# Patient Record
Sex: Female | Born: 1955
Health system: Southern US, Community
[De-identification: ages and names within clinical notes are randomized; demographics above are authoritative.]

## PROBLEM LIST (undated history)

## (undated) DIAGNOSIS — K219 Gastro-esophageal reflux disease without esophagitis: Secondary | ICD-10-CM

## (undated) DIAGNOSIS — F419 Anxiety disorder, unspecified: Secondary | ICD-10-CM

## (undated) DIAGNOSIS — D649 Anemia, unspecified: Secondary | ICD-10-CM

## (undated) DIAGNOSIS — F329 Major depressive disorder, single episode, unspecified: Secondary | ICD-10-CM

## (undated) DIAGNOSIS — F32A Depression, unspecified: Secondary | ICD-10-CM

## (undated) HISTORY — DX: Depression, unspecified: F32.A

## (undated) HISTORY — DX: Major depressive disorder, single episode, unspecified: F32.9

## (undated) HISTORY — DX: Anemia, unspecified: D64.9

## (undated) HISTORY — PX: BREAST SURGERY: SHX581

## (undated) HISTORY — PX: TONSILECTOMY, ADENOIDECTOMY, BILATERAL MYRINGOTOMY AND TUBES: SHX2538

## (undated) HISTORY — DX: Anxiety disorder, unspecified: F41.9

## (undated) HISTORY — DX: Gastro-esophageal reflux disease without esophagitis: K21.9

## (undated) HISTORY — PX: OTHER SURGICAL HISTORY: SHX169

---

## 2000-07-26 ENCOUNTER — Encounter: Payer: Self-pay | Admitting: Family Medicine

## 2000-07-26 ENCOUNTER — Encounter: Admission: RE | Admit: 2000-07-26 | Discharge: 2000-07-26 | Payer: Self-pay | Admitting: Family Medicine

## 2003-11-06 ENCOUNTER — Other Ambulatory Visit: Admission: RE | Admit: 2003-11-06 | Discharge: 2003-11-06 | Payer: Self-pay | Admitting: Family Medicine

## 2005-09-20 ENCOUNTER — Encounter: Admission: RE | Admit: 2005-09-20 | Discharge: 2005-09-20 | Payer: Self-pay | Admitting: Family Medicine

## 2005-10-25 ENCOUNTER — Ambulatory Visit (HOSPITAL_BASED_OUTPATIENT_CLINIC_OR_DEPARTMENT_OTHER): Admission: RE | Admit: 2005-10-25 | Discharge: 2005-10-25 | Payer: Self-pay | Admitting: Plastic Surgery

## 2005-10-25 ENCOUNTER — Encounter (INDEPENDENT_AMBULATORY_CARE_PROVIDER_SITE_OTHER): Payer: Self-pay | Admitting: *Deleted

## 2006-11-28 ENCOUNTER — Encounter: Admission: RE | Admit: 2006-11-28 | Discharge: 2006-11-28 | Payer: Self-pay | Admitting: Family Medicine

## 2006-12-18 ENCOUNTER — Other Ambulatory Visit: Admission: RE | Admit: 2006-12-18 | Discharge: 2006-12-18 | Payer: Self-pay | Admitting: Family Medicine

## 2007-02-26 ENCOUNTER — Ambulatory Visit (HOSPITAL_COMMUNITY): Admission: RE | Admit: 2007-02-26 | Discharge: 2007-02-26 | Payer: Self-pay | Admitting: Gastroenterology

## 2007-05-21 ENCOUNTER — Emergency Department (HOSPITAL_COMMUNITY): Admission: EM | Admit: 2007-05-21 | Discharge: 2007-05-22 | Payer: Self-pay | Admitting: Emergency Medicine

## 2007-12-19 ENCOUNTER — Encounter: Admission: RE | Admit: 2007-12-19 | Discharge: 2007-12-19 | Payer: Self-pay | Admitting: Family Medicine

## 2008-10-07 ENCOUNTER — Encounter: Admission: RE | Admit: 2008-10-07 | Discharge: 2008-10-07 | Payer: Self-pay | Admitting: Family Medicine

## 2010-01-01 ENCOUNTER — Other Ambulatory Visit: Admission: RE | Admit: 2010-01-01 | Discharge: 2010-01-01 | Payer: Self-pay | Admitting: Family Medicine

## 2010-01-25 ENCOUNTER — Encounter: Admission: RE | Admit: 2010-01-25 | Discharge: 2010-01-25 | Payer: Self-pay | Admitting: Family Medicine

## 2011-04-08 NOTE — Op Note (Signed)
Renee Roberts, Renee Roberts NO.:  [id number]   MEDICAL RECORD NO.:  [id number]          PATIENT TYPE:  AMB   LOCATION:  DSC                          FACILITY:  [facility]   PHYSICIAN:  Etter Sjogren, M.D.     DATE OF BIRTH:  June 26, 1956   DATE OF PROCEDURE:  10/25/2005  DATE OF DISCHARGE:                                 OPERATIVE REPORT   PREOPERATIVE DIAGNOSES:  1.  Lesion upper lip, 0.5 cm.  2.  Lesion abdomen 0.5 cm.  3.  Complicated open wound lip 1.0 cm.  4.  Complicated open wound of the abdomen 2.5 cm.   Dictation ended at this point.      Etter Sjogren, M.D.  Electronically Signed     DB/MEDQ  D:  10/25/2005  T:  10/26/2005  Job:  [id number]

## 2011-04-08 NOTE — Op Note (Signed)
NAMEKELSHA, OLDER NO.:  [id number]   MEDICAL RECORD NO.:  [id number]          PATIENT TYPE:  AMB   LOCATION:  DSC                          FACILITY:  [facility]   PHYSICIAN:  Etter Sjogren, M.D.     DATE OF BIRTH:  May 14, 1956   DATE OF PROCEDURE:  10/25/2005  DATE OF DISCHARGE:                                 OPERATIVE REPORT   PREOPERATIVE DIAGNOSES:  1.  Lesion, upper lip, 0.5 cm, undetermined behavior.  2.  Lesion of abdomen, 0.5 cm, undetermined behavior.   POSTOPERATIVE DIAGNOSES:  1.  Lesion, upper lip, 0.5 cm, undetermined behavior.  2.  Lesion of abdomen, 0.5 cm, undetermined behavior.  3.  Complicated wound of the lip, 1 cm.  4.  Complicated open wound of abdomen, 2.5 cm.   PROCEDURE PERFORMED:  1.  Excision of lesion of lip, 0.5 cm.  2.  Excision of lesion of abdomen, 0.5 cm.  3.  Complex wound closure of lip, 1 cm.  4.  Complex wound closure of abdomen, 2.5 cm.   SURGEON:  Etter Sjogren, M.D.   ANESTHESIA:  Xylocaine 1% with epinephrine plus bicarb.   CLINICAL NOTE:  This 55 year old woman has a lesion of the lip and abdomen  that have changed, enlarged and it is medically necessary to remove them.  The nature of the procedure and risks are well-understood by her including  the possibility for further surgery depending upon the final pathology  report.  She understood this and wished to proceed.   DESCRIPTION OF PROCEDURE:  The patient was marked for the excisions after  being prepped with Betadine and draped with sterile drapes.  Successful  local anesthesia was achieved and the excisions were performed.  The  specimens were removed and wounds cleansed thoroughly, irrigated thoroughly  and layered closures with 5-0 Monocryl interrupted and inverted deep sutures  and 5-0 Monocryl interrupted and deep dermal, and 6-0 Prolene simple running  suture.  Antibiotic ointment was applied, tolerated well.   We will see her back next week for  recheck.     Etter Sjogren, M.D.  Electronically Signed    DB/MEDQ  D:  10/25/2005  T:  10/26/2005  Job:  [id number]

## 2011-04-08 NOTE — Op Note (Signed)
NAMEJENIKA, Renee Roberts                 ACCOUNT NO.:  [id number]   MEDICAL RECORD NO.:  [id number]          PATIENT TYPE:  AMB   LOCATION:  ENDO                         FACILITY:  [facility]   PHYSICIAN:  John C. Madilyn Fireman, M.D.    DATE OF BIRTH:  1956/11/02   DATE OF PROCEDURE:  02/26/2007  DATE OF DISCHARGE:                               OPERATIVE REPORT   PROCEDURE:  Colonoscopy.   INDICATIONS FOR PROCEDURE:  Family history of colon cancer in two second-  degree relatives.   PROCEDURE:  The patient was placed in the left lateral decubitus  position and placed on the pulse monitor with continuous low-flow oxygen  delivered by nasal cannula.  She was sedated with 50 mcg IV fentanyl and  3 mg IV Versed.  The Olympus video colonoscope was inserted into the  rectum and advanced to the cecum, confirmed by transillumination of  McBurney's point and visualization of ileocecal valve and appendiceal  orifice.  The prep was excellent.  The cecum appeared normal with no  masses, polyps, diverticula or other mucosal abnormalities.  Within the  ascending colon there were seen several scattered diverticula.  No other  abnormalities.  The transverse colon appeared normal.  Within the  descending and sigmoid colon there were seen several scattered  diverticula with no other abnormalities.  The rectum appeared normal.  On retroflexed view, the anus revealed no obvious internal hemorrhoids.  The scope was then withdrawn and the patient returned to the recovery  room in stable condition.  He tolerated the procedure well.  There were  no immediate complications.   IMPRESSION:  Diverticulosis, otherwise normal study.   PLAN:  Repeat colonoscopy in 5-10 years.           ______________________________  Everardo All Madilyn Fireman, M.D.     JCH/MEDQ  D:  02/26/2007  T:  02/26/2007  Job:  [id number]   cc:   Sigmund Hazel, M.D.

## 2011-07-19 ENCOUNTER — Inpatient Hospital Stay (INDEPENDENT_AMBULATORY_CARE_PROVIDER_SITE_OTHER)
Admission: RE | Admit: 2011-07-19 | Discharge: 2011-07-19 | Disposition: A | Discharge status: Home / Self Care | Payer: 59 | Source: Ambulatory Visit | Attending: Family Medicine | Admitting: Family Medicine

## 2011-07-19 ENCOUNTER — Ambulatory Visit (INDEPENDENT_AMBULATORY_CARE_PROVIDER_SITE_OTHER): Payer: 59

## 2011-07-19 DIAGNOSIS — S92919A Unspecified fracture of unspecified toe(s), initial encounter for closed fracture: Secondary | ICD-10-CM

## 2013-06-14 ENCOUNTER — Other Ambulatory Visit: Payer: Self-pay

## 2013-06-14 DIAGNOSIS — Z1231 Encounter for screening mammogram for malignant neoplasm of breast: Secondary | ICD-10-CM

## 2013-06-25 ENCOUNTER — Ambulatory Visit
Admission: RE | Admit: 2013-06-25 | Discharge: 2013-06-25 | Disposition: A | Discharge status: Home / Self Care | Payer: 59 | Source: Ambulatory Visit

## 2013-06-25 DIAGNOSIS — Z1231 Encounter for screening mammogram for malignant neoplasm of breast: Secondary | ICD-10-CM

## 2013-07-26 ENCOUNTER — Other Ambulatory Visit: Payer: Self-pay | Admitting: Gastroenterology

## 2015-07-15 ENCOUNTER — Other Ambulatory Visit: Payer: Self-pay

## 2016-01-13 ENCOUNTER — Emergency Department
Admission: EM | Admit: 2016-01-13 | Discharge: 2016-01-13 | Disposition: A | Discharge status: Home / Self Care | Payer: 59 | Source: Home / Self Care | Attending: Family Medicine | Admitting: Family Medicine

## 2016-01-13 ENCOUNTER — Encounter: Payer: Self-pay | Admitting: *Deleted

## 2016-01-13 DIAGNOSIS — R1084 Generalized abdominal pain: Secondary | ICD-10-CM | POA: Diagnosis not present

## 2016-01-13 DIAGNOSIS — M533 Sacrococcygeal disorders, not elsewhere classified: Secondary | ICD-10-CM | POA: Diagnosis not present

## 2016-01-13 MED ORDER — CYCLOBENZAPRINE HCL 10 MG PO TABS: 10.0000 mg | ORAL_TABLET | Freq: Three times a day (TID) | ORAL | Status: DC

## 2016-01-13 MED ORDER — MELOXICAM 15 MG PO TABS: 15.0000 mg | ORAL_TABLET | Freq: Every day | ORAL | Status: DC

## 2016-01-13 NOTE — Discharge Instructions (Signed)
Begin clear liquids for about 18 to 24 hours, then may begin a BRAT diet (Bananas, Rice, Applesauce, Toast) when abdominal pain decreases.  Then gradually advance to a regular diet as tolerated.   Apply ice pack to right lower back for 20 to 30 minutes, 3 to 4 times daily  Continue until pain decreases.  Begin back exercises as tolerated.   If symptoms become significantly worse during the night or over the weekend, proceed to the local emergency room.

## 2016-01-13 NOTE — ED Provider Notes (Signed)
CSN: [id number]     Arrival date & time 01/13/16  [id number] History   First MD Initiated Contact with Patient 01/13/16 1018     Chief Complaint  Patient presents with  . Back Pain      HPI Comments: While vigorously exercising 2 days ago, patient suddenly experienced right lower back pain.  The pain does not radiate but has persisted and is worse when sitting.  Since onset of her right lower back pain she has developed vague abdominal bloating, discomfort, and constipation.  She had a similar right low back strain about five years ago resulting in similar GI symptoms.  A GI workup at that time was negative.  She notes that she has similar recurrent symptoms about every 3 to 6 months.  The GI symptoms always resolve spontaneously.  Patient is a 60 y.o. female presenting with back pain. The history is provided by the patient.  Back Pain Location:  Sacro-iliac joint Quality:  Aching Radiates to:  Does not radiate Pain severity:  Moderate Pain is:  Same all the time Onset quality:  Sudden Duration:  2 days Timing:  Constant Progression:  Unchanged Chronicity:  Recurrent Context comment:  Athletic activity Relieved by:  NSAIDs Worsened by:  Sitting and movement Ineffective treatments:  None tried Associated symptoms: abdominal pain and abdominal swelling   Associated symptoms: no bladder incontinence, no bowel incontinence, no dysuria, no fever, no leg pain, no numbness, no paresthesias, no pelvic pain, no perianal numbness, no tingling, no weakness and no weight loss   Abdominal pain:    Location:  Generalized   Quality:  Bloating   Severity:  Moderate   Onset quality:  Gradual   Duration:  2 days   Timing:  Constant   Progression:  Unchanged   Chronicity:  Recurrent   History reviewed. No pertinent past medical history. Past Surgical History  Procedure Laterality Date  . Breast surgery    . Cesarean section      x 3   Family History  Problem Relation Age of Onset  . Colon  cancer Father    Social History  Substance Use Topics  . Smoking status: Never Smoker   . Smokeless tobacco: None  . Alcohol Use: No   OB History    No data available     Review of Systems  Constitutional: Negative for fever and weight loss.  Gastrointestinal: Positive for abdominal pain. Negative for bowel incontinence.  Genitourinary: Negative for bladder incontinence, dysuria and pelvic pain.  Musculoskeletal: Positive for back pain.  Neurological: Negative for tingling, weakness, numbness and paresthesias.  All other systems reviewed and are negative.   Allergies  Review of patient's allergies indicates no known allergies.  Home Medications   Prior to Admission medications   Medication Sig Start Date End Date Taking? Authorizing Provider  cyclobenzaprine (FLEXERIL) 10 MG tablet Take 1 tablet (10 mg total) by mouth 3 (three) times daily. 01/13/16   Kandra Nicolas, MD  meloxicam (MOBIC) 15 MG tablet Take 1 tablet (15 mg total) by mouth daily. Take with food each morning 01/13/16   Kandra Nicolas, MD   Meds Ordered and Administered this Visit  Medications - No data to display  BP 128/82 mmHg  Pulse 78  Temp(Src) 97.6 F (36.4 C) (Oral)  Resp 18  Ht 5\' 6"  (1.676 m)  Wt 181 lb (82.101 kg)  BMI 29.23 kg/m2  SpO2 98% No data found.   Physical Exam  Constitutional: She is oriented  to person, place, and time. She appears well-developed and well-nourished. No distress.  HENT:  Head: Normocephalic.  Mouth/Throat: Oropharynx is clear and moist.  Eyes: Conjunctivae are normal. Pupils are equal, round, and reactive to light.  Neck: Neck supple.  Cardiovascular: Normal heart sounds.   Pulmonary/Chest: Breath sounds normal.  Abdominal: Soft. Normal appearance and bowel sounds are normal. She exhibits no distension and no mass. There is no hepatosplenomegaly. There is tenderness. There is tenderness at McBurney's point. There is no rigidity, no rebound, no guarding, no CVA  tenderness and negative Murphy's sign. No hernia.  Abdomen has vague diffuse tenderness without masses or hepatosplenomegaly.  Musculoskeletal: She exhibits no edema or tenderness.       Lumbar back: She exhibits decreased range of motion.  Back:  Decreased range of motion. Can heel/toe walk and squat without difficulty.  There is tenderness to palpation over the right SI joint. Straight leg raising test is negative.  Sitting knee extension test is negative.  Strength and sensation in the lower extremities is normal.  Patellar and achilles reflexes are normal. FABER lateralizes to the right SI joint. Right leg is approximately 1cm shorter than the left by inspection.   Lymphadenopathy:    She has no cervical adenopathy.  Neurological: She is alert and oriented to person, place, and time.  Skin: Skin is warm and dry. No rash noted.  Nursing note and vitals reviewed.   ED Course  Procedures  none  MDM   1. Sacroiliac joint dysfunction of right side   2. Generalized abdominal pain    Begin Mobid 15mg  daily, and Flexeril 10mg  TID Begin clear liquids for about 18 to 24 hours, then may begin a [employer] (Bananas, Rice, Applesauce, Toast) when abdominal pain decreases.  Then gradually advance to a regular diet as tolerated.   Apply ice pack to right lower back for 20 to 30 minutes, 3 to 4 times daily  Continue until pain decreases.  Begin back exercises as tolerated.   If symptoms become significantly worse during the night or over the weekend, proceed to the local emergency room.   Followup with Family Doctor if not improved in one to two weeks    Kandra Nicolas, MD 01/13/16 2026

## 2016-01-13 NOTE — ED Notes (Signed)
Pt c/o RT sided LBP x 2 days after exercising with bloating and constipation. She took Aleve at 0400 with some relief. She reports having this pain in the past. She had a GI series done 8 years ago and saw a GI MD 5 years ago without a dx.

## 2016-01-16 ENCOUNTER — Telehealth: Payer: Self-pay | Admitting: Emergency Medicine

## 2016-11-21 HISTORY — PX: REDUCTION MAMMAPLASTY: SUR839

## 2017-04-20 ENCOUNTER — Encounter: Payer: Self-pay | Admitting: Gastroenterology

## 2017-06-07 ENCOUNTER — Ambulatory Visit (AMBULATORY_SURGERY_CENTER): Payer: Self-pay

## 2017-06-07 VITALS — Ht 66.0 in | Wt 177.8 lb

## 2017-06-07 DIAGNOSIS — Z1211 Encounter for screening for malignant neoplasm of colon: Secondary | ICD-10-CM

## 2017-06-07 MED ORDER — NA SULFATE-K SULFATE-MG SULF 17.5-3.13-1.6 GM/177ML PO SOLN: 1.0000 | Freq: Once | ORAL | 0 refills | Status: AC

## 2017-06-07 NOTE — Progress Notes (Signed)
Denies allergies to eggs or soy products. Denies complication of anesthesia or sedation. Denies use of weight loss medication. Denies use of O2.   Emmi instructions given for colonoscopy.  

## 2017-06-08 ENCOUNTER — Encounter: Payer: Self-pay | Admitting: Gastroenterology

## 2017-06-19 ENCOUNTER — Telehealth: Payer: Self-pay | Admitting: Gastroenterology

## 2017-06-19 DIAGNOSIS — Z1211 Encounter for screening for malignant neoplasm of colon: Secondary | ICD-10-CM

## 2017-06-19 MED ORDER — SUPREP BOWEL PREP KIT 17.5-3.13-1.6 GM/177ML PO SOLN: 1.0000 | Freq: Once | ORAL | 0 refills | Status: AC

## 2017-06-19 MED ORDER — SUPREP BOWEL PREP KIT 17.5-3.13-1.6 GM/177ML PO SOLN: 1.0000 | Freq: Once | ORAL | 0 refills | Status: DC

## 2017-06-19 NOTE — Telephone Encounter (Signed)
Prep was accidentally sent to another [facility] in another [place].  Resent prep and called pharmacy. Julene Rahn/PV

## 2017-06-20 ENCOUNTER — Telehealth: Payer: Self-pay | Admitting: Gastroenterology

## 2017-06-20 NOTE — Telephone Encounter (Signed)
Understood. I will not charge a late cancel fee because I was able to fill the slot with a clinic patient from today. However, I would like to investigate how this only came to light the day prior to the procedure if usual benefits review was done after the procedure was originally scheduled.

## 2017-06-21 ENCOUNTER — Encounter: Payer: 59 | Admitting: Gastroenterology

## 2017-06-21 NOTE — Telephone Encounter (Signed)
Mrs Fernholz was mailed a letter on [phone] from our [employer].  Which gave her plenty of time to call and cancel her procedure.  Her insurance quoted that they would cover her procedure at 95% which is actually really good. She still has $1348 on her out of pocket to meet before they cover at 100% which might be why she probably cancelled.So it's not that her insurance will not cover the procedure, they do.  Just not 100% of it at this time.

## 2018-02-19 ENCOUNTER — Encounter (HOSPITAL_COMMUNITY): Payer: Self-pay

## 2018-02-19 ENCOUNTER — Other Ambulatory Visit: Payer: Self-pay

## 2018-02-19 ENCOUNTER — Emergency Department (HOSPITAL_COMMUNITY)
Admission: EM | Admit: 2018-02-19 | Discharge: 2018-02-19 | Disposition: A | Discharge status: Home / Self Care | Payer: 59 | Attending: Emergency Medicine | Admitting: Emergency Medicine

## 2018-02-19 DIAGNOSIS — M542 Cervicalgia: Secondary | ICD-10-CM | POA: Insufficient documentation

## 2018-02-19 DIAGNOSIS — M545 Low back pain, unspecified: Secondary | ICD-10-CM

## 2018-02-19 DIAGNOSIS — Y9389 Activity, other specified: Secondary | ICD-10-CM | POA: Insufficient documentation

## 2018-02-19 DIAGNOSIS — X500XXA Overexertion from strenuous movement or load, initial encounter: Secondary | ICD-10-CM | POA: Diagnosis not present

## 2018-02-19 MED ORDER — CYCLOBENZAPRINE HCL 10 MG PO TABS: 10.0000 mg | ORAL_TABLET | Freq: Three times a day (TID) | ORAL | 0 refills | Status: DC | PRN

## 2018-02-19 NOTE — ED Notes (Signed)
Pt noted to be tearful during assessment.

## 2018-02-19 NOTE — ED Provider Notes (Signed)
[facility] Provider Note   CSN: [id number] Arrival date & time: 02/19/18  2725  History   Chief Complaint Chief Complaint  Patient presents with  . Back Pain    HPI Renee Roberts is a 62 y.o. female.  HPI  Patient is a 62yo F presenting with low back pain. Began 5d ago. Lifted a heavy bag of soil about 8 days ago, but did not develop severe pain until 5d ago. Pain is worse with changes in position, twisting motions, and sitting. Pain began to improve two days ago, however she then drove to [place], which worsened pain after being seated for an extended period of time. Pain began in R lumbar region, is now primarily on L. Intermittently has sharp shooting pain that radiates across lumbar region. No radiation of pain down legs. Denies numbness, tingling, weakness of lower extremities. Denies bowel or bladder incontinence. Has been taking naproxen and using a heating pad, both of which have been helpful. Has had many similar episodes of pain in the past, usually occurred about every 2 years and typically precipitated by lifting a heavy object. Is concerned because current episode is lasting longer than previous episodes. In the past, Flexeril has been very helpful. 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 Endorses mild neck pain a couple days ago that has since resolved.    Past Medical History:  Diagnosis Date  . Anemia   . Anxiety   . Depression   . GERD (gastroesophageal reflux disease)     There are no active problems to display for this patient.   Past Surgical History:  Procedure Laterality Date  . BREAST SURGERY    . CESAREAN SECTION     x 3  . Fractured Finger Left    fifth finger  . TONSILECTOMY, ADENOIDECTOMY, BILATERAL MYRINGOTOMY AND TUBES       OB History   None      Home Medications    Prior to Admission medications   Medication Sig Start Date End Date Taking? Authorizing Provider    cyclobenzaprine (FLEXERIL) 10 MG tablet Take 1 tablet (10 mg total) by mouth 3 (three) times daily as needed for muscle spasms. 02/19/18   Verner Mould, MD    Family History Family History  Problem Relation Age of Onset  . Pancreatic cancer Maternal Grandmother   . Colon cancer Paternal Grandfather   . Esophageal cancer Neg Hx   . Rectal cancer Neg Hx   . Stomach cancer Neg Hx     Social History Social History   Tobacco Use  . Smoking status: Never Smoker  . Smokeless tobacco: Never Used  Substance Use Topics  . Alcohol use: No  . Drug use: No     Allergies   Patient has no known allergies.   Review of Systems Review of Systems  Gastrointestinal: Negative for constipation and diarrhea.  Musculoskeletal: Positive for back pain and neck pain.  Neurological: Negative for weakness and numbness.     Physical Exam Updated Vital Signs BP (!) 162/102 (BP Location: Right Arm)   Pulse (!) 120   Temp 97.9 F (36.6 C) (Oral)   Resp 20   SpO2 100%   Physical Exam  Constitutional: She is oriented to person, place, and time. She appears well-developed and well-nourished. No distress.  Standing throughout encounter as reports it is too painful to sit down  HENT:  Head: Normocephalic and atraumatic.  Nose: Nose normal.  Mouth/Throat: Oropharynx is clear and moist. No oropharyngeal exudate.  Eyes: Pupils are equal, round, and reactive to light. Conjunctivae and EOM are normal. Right eye exhibits no discharge. Left eye exhibits no discharge.  Neck: Normal range of motion. Neck supple.  Cardiovascular: Normal rate, regular rhythm and normal heart sounds.  No murmur heard. Pulmonary/Chest: Effort normal and breath sounds normal. No respiratory distress.  Abdominal: Soft. Bowel sounds are normal. She exhibits no distension. There is no tenderness.  Musculoskeletal:  No TTP of back, including lumbar region.  Full ROM, though reports pain worse with spinal flexion  and patient hesitant to perform this movement.  5/5 strength upper extremities bilaterally.  Able to walk and stand with no difficulty and no assistance.   Neurological: She is alert and oriented to person, place, and time.  Skin: Skin is warm and dry.  Psychiatric:  Tearful throughout encounter     ED Treatments / Results  Labs (all labs ordered are listed, but only abnormal results are displayed) Labs Reviewed - No data to display  EKG None  Radiology No results found.  Procedures Procedures (including critical care time)  Medications Ordered in ED Medications - No data to display   Initial Impression / Assessment and Plan / ED Course  I have reviewed the triage vital signs and the nursing notes.  Pertinent labs & imaging results that were available during my care of the patient were reviewed by me and considered in my medical decision making (see chart for details).     62 yo F presenting with low back pain. Most consistent with MSK etiology beginning after lifting heavy bag of soil. No TTP of lumbar region, though worsening with certain movements suggestive of MSK etiology. Full ROM though does have significant pain with spinal flexion. As such, could consider compression fracture, however patient very active (is a physical therapist) and relatively young so this is less likely. No red flags, so less likely cauda equina or similar neurologic etiology, and imaging not indicated at this time. Will treat conservatively with scheduled NSAIDs over next 3-5d as well as Flexeril TID PRN. Can continue heat as well. Will recommend ortho f/u. Could consider imaging to rule out compression fx or other possible etiologies if sx continue to recur. Return precautions discussed.   Final Clinical Impressions(s) / ED Diagnoses   Final diagnoses:  Acute bilateral low back pain without sciatica    ED Discharge Orders        Ordered    cyclobenzaprine (FLEXERIL) 10 MG tablet  3 times  daily PRN     02/19/18 0938     Adin Hector, MD, MPH PGY-3 [facility] Pager [phone]    Verner Mould, MD 02/19/18 9201    Carmin Muskrat, MD 02/19/18 [phone]

## 2018-02-19 NOTE — ED Notes (Signed)
EDP at bedside  

## 2018-02-19 NOTE — ED Triage Notes (Signed)
Pt states hx of back pain that occurs every couple of years. She states the pain generally starts after lifting something and is worsened by certain chemicals in food. Pt states she was having a bowel movement this morning and she began having severe sharp pains and spasms in her back. Pt tearful in triage.

## 2018-02-19 NOTE — ED Notes (Signed)
Patient verbalizes understanding of discharge instructions. Opportunity for questioning and answers were provided. Armband removed by staff, pt discharged from ED.  

## 2018-02-19 NOTE — Discharge Instructions (Signed)
For your back pain, please begin taking two regular strength ibuprofen tablets (400 mg total) every 8 hours for the next 3-5 days. Alternatively, you can take two regular strength naproxen tablets (500 mg total) every 12 hours for the next 3-5 days. You can also take one Flexeril tablet up to every 8 hours as needed for pain or back spasms. You can continue to use the heating pad as needed. Try to stay mobile, however avoid any heavy lifting.  If your pain worsens, you have numbness or weakness of your legs, or you have difficulty controlling your bladder or bowels, please return to the emergency room.

## 2018-03-25 DIAGNOSIS — H1033 Unspecified acute conjunctivitis, bilateral: Secondary | ICD-10-CM | POA: Diagnosis not present

## 2019-04-05 DIAGNOSIS — U071 COVID-19: Secondary | ICD-10-CM | POA: Diagnosis not present

## 2019-06-11 ENCOUNTER — Other Ambulatory Visit: Payer: Self-pay

## 2019-06-11 ENCOUNTER — Encounter (HOSPITAL_COMMUNITY): Payer: Self-pay

## 2019-06-11 ENCOUNTER — Ambulatory Visit (HOSPITAL_COMMUNITY)
Admission: EM | Admit: 2019-06-11 | Discharge: 2019-06-11 | Disposition: A | Discharge status: Home / Self Care | Payer: 59 | Attending: Family Medicine | Admitting: Family Medicine

## 2019-06-11 DIAGNOSIS — Z20828 Contact with and (suspected) exposure to other viral communicable diseases: Secondary | ICD-10-CM | POA: Insufficient documentation

## 2019-06-11 DIAGNOSIS — H1031 Unspecified acute conjunctivitis, right eye: Secondary | ICD-10-CM | POA: Diagnosis not present

## 2019-06-11 DIAGNOSIS — Z20822 Contact with and (suspected) exposure to covid-19: Secondary | ICD-10-CM

## 2019-06-11 MED ORDER — TOBRAMYCIN 0.3 % OP SOLN: 1.0000 [drp] | OPHTHALMIC | 0 refills | Status: DC

## 2019-06-11 NOTE — Discharge Instructions (Addendum)
Use eyedrops every 4 hours for the next few days until redness is relieved Careful handwashing to prevent spread Stay out of work until coronavirus test is confirmed negative

## 2019-06-11 NOTE — ED Triage Notes (Signed)
Patient presents to Urgent Care with complaints of right eye irritation since last night. Patient reports she thinks it is pink eye.

## 2019-06-11 NOTE — ED Provider Notes (Signed)
[place]    CSN: [id number] Arrival date & time: 06/11/19  9233      History   Chief Complaint Chief Complaint  Patient presents with  . Appointment    9:10  . Conjunctivitis    HPI Renee Roberts is a 63 y.o. female.   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  She went to work this morning although she was having some irritation in her right eye.  As the morning is progressed it is become more irritated and red.  Some tearing.  Some yellow discharge this morning.  No foreign body sensation.  No trouble with vision.  She is also had some mild runny nose.  No known exposure to COVID-19.  No fever chills.  No body aches.  No coughing or shortness of breath.  No GI symptoms  Past Medical History:  Diagnosis Date  . Anemia   . Anxiety   . Depression   . GERD (gastroesophageal reflux disease)     There are no active problems to display for this patient.   Past Surgical History:  Procedure Laterality Date  . BREAST SURGERY    . CESAREAN SECTION     x 3  . Fractured Finger Left    fifth finger  . TONSILECTOMY, ADENOIDECTOMY, BILATERAL MYRINGOTOMY AND TUBES      OB History   No obstetric history on file.      Home Medications    Prior to Admission medications   Medication Sig Start Date End Date Taking? Authorizing Provider  tobramycin (TOBREX) 0.3 % ophthalmic solution Place 1 drop into the right eye every 4 (four) hours. 06/11/19   Raylene Everts, MD    Family History Family History  Problem Relation Age of Onset  . Pancreatic cancer Maternal Grandmother   . Colon cancer Paternal Grandfather   . Diabetes Mother   . COPD Father   . Hypertension Father   . Esophageal cancer Neg Hx   . Rectal cancer Neg Hx   . Stomach cancer Neg Hx     Social History Social History   Tobacco Use  . Smoking status: Never Smoker  . Smokeless tobacco: Never Used  Substance Use Topics  . Alcohol use: No  . Drug use: No     Allergies   Patient has no known allergies.   Review of Systems Review of Systems  Constitutional: Negative for chills and fever.  HENT: Negative for ear pain and sore throat.   Eyes: Positive for redness. Negative for pain and visual disturbance.  Respiratory: Negative for cough and shortness of breath.   Cardiovascular: Negative for chest pain and palpitations.  Gastrointestinal: Negative for abdominal pain and vomiting.  Genitourinary: Negative for dysuria and hematuria.  Musculoskeletal: Negative for arthralgias and back pain.  Skin: Negative for color change and rash.  Neurological: Negative for seizures and syncope.  All other systems reviewed and are negative.    Physical Exam Triage Vital Signs ED Triage Vitals  Enc Vitals Group     BP 06/11/19 0920 115/82     Pulse Rate 06/11/19 0920 79     Resp 06/11/19 0920 16     Temp 06/11/19 0920 97.8 F (36.6 C)     Temp Source 06/11/19 0920 Oral     SpO2 06/11/19 0920 98 %     Weight --      Height --      Head Circumference --  Peak Flow --      Pain Score 06/11/19 0925 4     Pain Loc --      Pain Edu? --      Excl. in [place]? --    No data found.  Updated Vital Signs BP 115/82 (BP Location: Left Arm)   Pulse 79   Temp 97.8 F (36.6 C) (Oral)   Resp 16   SpO2 98%       Physical Exam Constitutional:      General: She is not in acute distress.    Appearance: She is well-developed.  HENT:     Head: Normocephalic and atraumatic.     Nose: Nose normal.  Eyes:     General:        Right eye: No discharge.        Left eye: No discharge.     Conjunctiva/sclera: Conjunctivae normal.     Pupils: Pupils are equal, round, and reactive to light.     Comments: Conjunctival injection right eye, lateral greater than medial.  No FB  Neck:     Musculoskeletal: Normal range of motion.  Cardiovascular:     Rate and Rhythm: Normal rate and regular rhythm.     Heart sounds: Normal heart sounds.  Pulmonary:      Effort: Pulmonary effort is normal. No respiratory distress.     Breath sounds: Normal breath sounds.  Abdominal:     General: There is no distension.     Palpations: Abdomen is soft.  Musculoskeletal: Normal range of motion.  Skin:    General: Skin is warm and dry.  Neurological:     Mental Status: She is alert.      UC Treatments / Results  Labs (all labs ordered are listed, but only abnormal results are displayed) Labs Reviewed  NOVEL CORONAVIRUS, NAA (HOSPITAL ORDER, SEND-OUT TO REF LAB)    EKG   Radiology No results found.  Procedures Procedures (including critical care time)  Medications Ordered in UC Medications - No data to display  Initial Impression / Assessment and Plan / UC Course  I have reviewed the triage vital signs and the nursing notes.  Pertinent labs & imaging results that were available during my care of the patient were reviewed by me and considered in my medical decision making (see chart for details).     We discussed that although unlikely, conjunctivitis can.  Precursor for COVID-19.  I recommend COVID-19 testing.  Patient needs to stay out of work until this test result is available. Final Clinical Impressions(s) / UC Diagnoses   Final diagnoses:  Acute bacterial conjunctivitis of right eye  Exposure to Covid-19 Virus     Discharge Instructions     Use eyedrops every 4 hours for the next few days until redness is relieved Careful handwashing to prevent spread Stay out of work until coronavirus test is confirmed negative     ED Prescriptions    Medication Sig Dispense Auth. Provider   tobramycin (TOBREX) 0.3 % ophthalmic solution Place 1 drop into the right eye every 4 (four) hours. 5 mL Raylene Everts, MD     Controlled Substance Prescriptions [place] Controlled Substance Registry consulted? Not Applicable   Raylene Everts, MD 06/11/19 1015

## 2019-06-13 LAB — NOVEL CORONAVIRUS, NAA (HOSP ORDER, SEND-OUT TO REF LAB; TAT 18-24 HRS): SARS-CoV-2, NAA: NOT DETECTED

## 2019-06-14 ENCOUNTER — Other Ambulatory Visit: Payer: Self-pay

## 2019-06-14 ENCOUNTER — Emergency Department (INDEPENDENT_AMBULATORY_CARE_PROVIDER_SITE_OTHER)
Admission: EM | Admit: 2019-06-14 | Discharge: 2019-06-14 | Disposition: A | Discharge status: Home / Self Care | Payer: 59 | Source: Home / Self Care

## 2019-06-14 DIAGNOSIS — H1031 Unspecified acute conjunctivitis, right eye: Secondary | ICD-10-CM | POA: Diagnosis not present

## 2019-06-14 MED ORDER — OLOPATADINE HCL 0.1 % OP SOLN: 1.0000 [drp] | Freq: Two times a day (BID) | OPHTHALMIC | 0 refills | Status: DC

## 2019-06-14 NOTE — ED Provider Notes (Signed)
[name]    CSN: [id number] Arrival date & time: 06/14/19  0802     History   Chief Complaint Chief Complaint  Patient presents with  . Eye Problem    HPI BRETTE CAST is a 63 y.o. female.   HPI TERRAH DECOSTER is a 63 y.o. female presenting to UC with c/o 5 days of Right eye redness and mild soreness. Pt was seen at [facility] on 06/11/2019, was prescribed Tobramycin eye drops and has been using as prescribed w/o relief. 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 She did test Negative for Covid-19 from her visit the other day. Denies any other symptoms including no fever, chills, cough, congestion or sore throat.  No n/v/d. No chest pain or SOB.  She wears glasses but not contacts. No known trauma to her eye.    Past Medical History:  Diagnosis Date  . Anemia   . Anxiety   . Depression   . GERD (gastroesophageal reflux disease)     There are no active problems to display for this patient.   Past Surgical History:  Procedure Laterality Date  . BREAST SURGERY    . CESAREAN SECTION     x 3  . Fractured Finger Left    fifth finger  . TONSILECTOMY, ADENOIDECTOMY, BILATERAL MYRINGOTOMY AND TUBES      OB History   No obstetric history on file.      Home Medications    Prior to Admission medications   Medication Sig Start Date End Date Taking? Authorizing Provider  olopatadine (PATANOL) 0.1 % ophthalmic solution Place 1 drop into the right eye 2 (two) times daily. For at least 7 days 06/14/19   Noe Gens, PA-C  tobramycin (TOBREX) 0.3 % ophthalmic solution Place 1 drop into the right eye every 4 (four) hours. 06/11/19   Raylene Everts, MD    Family History Family History  Problem Relation Age of Onset  . Pancreatic cancer Maternal Grandmother   . Colon cancer Paternal Grandfather   . Diabetes Mother   . COPD Father   . Hypertension Father   . Esophageal cancer Neg Hx   .  Rectal cancer Neg Hx   . Stomach cancer Neg Hx     Social History Social History   Tobacco Use  . Smoking status: Never Smoker  . Smokeless tobacco: Never Used  Substance Use Topics  . Alcohol use: No  . Drug use: No     Allergies   Patient has no known allergies.   Review of Systems Review of Systems  Constitutional: Negative for chills and fever.  HENT: Negative for congestion, rhinorrhea and sore throat.   Eyes: Positive for pain and redness. Negative for photophobia, discharge and visual disturbance.  Respiratory: Negative for cough.      Physical Exam Triage Vital Signs ED Triage Vitals  Enc Vitals Group     BP 06/14/19 0819 119/78     Pulse Rate 06/14/19 0819 79     Resp 06/14/19 0819 18     Temp 06/14/19 0819 98.2 F (36.8 C)     Temp Source 06/14/19 0819 Oral     SpO2 06/14/19 0819 96 %     Weight 06/14/19 0820 190 lb (86.2 kg)     Height 06/14/19 0820 5\' 6"  (1.676 m)     Head Circumference --  Peak Flow --      Pain Score 06/14/19 0820 0     Pain Loc --      Pain Edu? --      Excl. in [place]? --    No data found.  Updated Vital Signs BP 119/78 (BP Location: Right Arm)   Pulse 79   Temp 98.2 F (36.8 C) (Oral)   Resp 18   Ht 5\' 6"  (1.676 m)   Wt 190 lb (86.2 kg)   SpO2 96%   BMI 30.67 kg/m   Visual Acuity Right Eye Distance: 20/40-1 Left Eye Distance: 20/25-1 Bilateral Distance: 20/25-1(w/o glasses. Wears glasses for reading.)  Right Eye Near:   Left Eye Near:    Bilateral Near:     Physical Exam Vitals signs and nursing note reviewed.  Constitutional:      Appearance: Normal appearance. She is well-developed.  HENT:     Head: Normocephalic and atraumatic.     Nose: Nose normal.     Mouth/Throat:     Mouth: Mucous membranes are moist.  Eyes:     General:        Right eye: [place]de[place] ( possible early/mild tenderness to mid/medial aspect of upper eyelid) present. No discharge.        Left eye: No discharge.     Extraocular  Movements: Extraocular movements intact.     Conjunctiva/sclera:     Right eye: Right conjunctiva is injected ( primarily along medial aspect ).     Pupils: Pupils are equal, round, and reactive to light.      Comments: No fluorescein uptake. No foreign bodies.  Neck:     Musculoskeletal: Normal range of motion.  Cardiovascular:     Rate and Rhythm: Normal rate.  Pulmonary:     Effort: Pulmonary effort is normal.  Musculoskeletal: Normal range of motion.  Skin:    General: Skin is warm and dry.  Neurological:     Mental Status: She is alert and oriented to person, place, and time.  Psychiatric:        Behavior: Behavior normal.      UC Treatments / Results  Labs (all labs ordered are listed, but only abnormal results are displayed) Labs Reviewed - No data to display  EKG   Radiology No results found.  Procedures Procedures (including critical care time)  Medications Ordered in UC Medications - No data to display  Initial Impression / Assessment and Plan / UC Course  I have reviewed the triage vital signs and the nursing notes.  Pertinent labs & imaging results that were available during my care of the patient were reviewed by me and considered in my medical decision making (see chart for details).     Reviewed medical records. No systemic symptoms. Pt may continue to use prescribed antibiotic drops, will also add Patanol eye drops as well as encourage warm compresses F/u with PCP or eye specialist early next week if not improving. AVS provided.  Final Clinical Impressions(s) / UC Diagnoses   Final diagnoses:  Acute conjunctivitis of right eye, unspecified acute conjunctivitis type     Discharge Instructions      You should continue the antibiotic drops you were prescribed the other day as well as try the next drops prescribed today.  Be sure to space out the medications by at least 30 minutes so you are not flushing one out with the other.  You may try  applying a warm damp washcloth over your upper eyelid  2-3 times daily for 10-15 minutes at a time in case you are developing a stye (infection in your eyelid, please read more info in this packet).  Please follow up with family medicine in 4-5 days if not improving, sooner if worsening or new symptoms develop. Or, please follow up with an eye specialist found in the resource packet given to you today for further evaluation and treatment of your eye symptoms.     ED Prescriptions    Medication Sig Dispense Auth. Provider   olopatadine (PATANOL) 0.1 % ophthalmic solution Place 1 drop into the right eye 2 (two) times daily. For at least 7 days 5 mL Noe Gens, PA-C     Controlled Substance Prescriptions [place] Controlled Substance Registry consulted? Not Applicable   Tyrell Antonio 06/14/19 7357

## 2019-06-14 NOTE — Discharge Instructions (Signed)
°  You should continue the antibiotic drops you were prescribed the other day as well as try the next drops prescribed today.  Be sure to space out the medications by at least 30 minutes so you are not flushing one out with the other.  You may try applying a warm damp washcloth over your upper eyelid 2-3 times daily for 10-15 minutes at a time in case you are developing a stye (infection in your eyelid, please read more info in this packet).  Please follow up with family medicine in 4-5 days if not improving, sooner if worsening or new symptoms develop. Or, please follow up with an eye specialist found in the resource packet given to you today for further evaluation and treatment of your eye symptoms.

## 2019-06-14 NOTE — ED Triage Notes (Signed)
Pt c/o RT eye redness x 5 days. No better after her visit at [facility] urgent care on 06/11/19.

## 2019-08-28 DIAGNOSIS — Z1159 Encounter for screening for other viral diseases: Secondary | ICD-10-CM | POA: Diagnosis not present

## 2020-08-20 ENCOUNTER — Encounter: Payer: Self-pay | Admitting: Emergency Medicine

## 2020-08-20 ENCOUNTER — Emergency Department (INDEPENDENT_AMBULATORY_CARE_PROVIDER_SITE_OTHER)
Admission: EM | Admit: 2020-08-20 | Discharge: 2020-08-20 | Disposition: A | Discharge status: Home / Self Care | Payer: 59 | Source: Home / Self Care

## 2020-08-20 ENCOUNTER — Telehealth: Payer: Self-pay

## 2020-08-20 ENCOUNTER — Other Ambulatory Visit: Payer: Self-pay

## 2020-08-20 DIAGNOSIS — R6889 Other general symptoms and signs: Secondary | ICD-10-CM

## 2020-08-20 DIAGNOSIS — Z9189 Other specified personal risk factors, not elsewhere classified: Secondary | ICD-10-CM

## 2020-08-20 DIAGNOSIS — R42 Dizziness and giddiness: Secondary | ICD-10-CM | POA: Diagnosis not present

## 2020-08-20 DIAGNOSIS — Z7689 Persons encountering health services in other specified circumstances: Secondary | ICD-10-CM | POA: Diagnosis not present

## 2020-08-20 DIAGNOSIS — I444 Left anterior fascicular block: Secondary | ICD-10-CM

## 2020-08-20 LAB — POCT CBC W AUTO DIFF (K'VILLE URGENT CARE)

## 2020-08-20 LAB — COMPLETE METABOLIC PANEL WITH GFR
AG Ratio: 1.6 (calc) (ref 1.0–2.5)
ALT: 19 U/L (ref 6–29)
AST: 17 U/L (ref 10–35)
Albumin: 4.1 g/dL (ref 3.6–5.1)
Alkaline phosphatase (APISO): 75 U/L (ref 37–153)
BUN: 12 mg/dL (ref 7–25)
CO2: 21 mmol/L (ref 20–32)
Calcium: 8.9 mg/dL (ref 8.6–10.4)
Chloride: 105 mmol/L (ref 98–110)
Creat: 0.76 mg/dL (ref 0.50–0.99)
GFR, Est African American: 96 mL/min/{1.73_m2} (ref >=60)
GFR, Est Non African American: 83 mL/min/{1.73_m2} (ref >=60)
Globulin: 2.6 g/dL (calc) (ref 1.9–3.7)
Glucose, Bld: 133 mg/dL — ABNORMAL HIGH (ref 65–99)
Potassium: 4.1 mmol/L (ref 3.5–5.3)
Sodium: 137 mmol/L (ref 135–146)
Total Bilirubin: 0.4 mg/dL (ref 0.2–1.2)
Total Protein: 6.7 g/dL (ref 6.1–8.1)

## 2020-08-20 LAB — POCT FASTING CBG KUC MANUAL ENTRY: POCT Glucose (KUC): 149 mg/dL — AB (ref 70–99)

## 2020-08-20 LAB — TROPONIN I: Troponin I: <3 ng/L (ref <=47)

## 2020-08-20 LAB — POC SARS CORONAVIRUS 2 AG -  ED: SARS Coronavirus 2 Ag: NEGATIVE

## 2020-08-20 MED ORDER — ONDANSETRON 4 MG PO TBDP
4.0000 mg | ORAL_TABLET | Freq: Once | ORAL | Status: AC
  Administered 2020-08-20: 4 mg via ORAL

## 2020-08-20 MED ORDER — ACETAMINOPHEN 500 MG PO TABS
1000.0000 mg | ORAL_TABLET | Freq: Once | ORAL | Status: AC
  Administered 2020-08-20: 1000 mg via ORAL

## 2020-08-20 NOTE — ED Triage Notes (Signed)
Dizzy w/ nausea approx 1 hour pta at work  C/o chills in triage (101.9 temp) Had coffee this am - no breakfast Ate an apple 40 min ago & had a graham cracker - CBG in triage 149 Denies diabetes Had COVID vaccine  Denies HA No meds for reflux  Denies HTN

## 2020-08-20 NOTE — ED Provider Notes (Signed)
[name]    CSN: [id number] Arrival date & time: 08/20/20  1107      History   Chief Complaint Chief Complaint  Patient presents with  . Nausea  . Dizziness    HPI Renee Roberts is a 64 y.o. female.   HPI  Renee Roberts is a 64 y.o. female presenting to UC with c/o dizziness and nausea that started about 1 hour PTA while she was at work. She also developed chills so she went to lay in her car to warm up.  Pt feels like she is dizzy and feels like she is going to pass out. Pt feels like it could be due to low blood sugar but no hx of diabetes. Pt had some coffee this morning but no breakfast.  She did have an apple and graham cracker 27min PTA.  CBG in triage 149.  Pt fully vaccinated with [place] vaccine in January/February 2021.  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   No medication taken PTA.  Denies chest pain or SOB. No hx of heart disease.    Past Medical History:  Diagnosis Date  . Anemia   . Anxiety   . Depression   . GERD (gastroesophageal reflux disease)     There are no problems to display for this patient.   Past Surgical History:  Procedure Laterality Date  . BREAST SURGERY    . CESAREAN SECTION     x 3  . Fractured Finger Left    fifth finger  . TONSILECTOMY, ADENOIDECTOMY, BILATERAL MYRINGOTOMY AND TUBES      OB History   No obstetric history on file.      Home Medications    Prior to Admission medications   Medication Sig Start Date End Date Taking? Authorizing Provider  olopatadine (PATANOL) 0.1 % ophthalmic solution Place 1 drop into the right eye 2 (two) times daily. For at least 7 days 06/14/19   Noe Gens, PA-C  tobramycin (TOBREX) 0.3 % ophthalmic solution Place 1 drop into the right eye every 4 (four) hours. 06/11/19   Raylene Everts, MD    Family History Family History  Problem Relation Age of Onset    . Pancreatic cancer Maternal Grandmother   . Colon cancer Paternal Grandfather   . Diabetes Mother   . COPD Father   . Hypertension Father   . Esophageal cancer Neg Hx   . Rectal cancer Neg Hx   . Stomach cancer Neg Hx     Social History Social History   Tobacco Use  . Smoking status: Never Smoker  . Smokeless tobacco: Never Used  Vaping Use  . Vaping Use: Never used  Substance Use Topics  . Alcohol use: No  . Drug use: No     Allergies   Patient has no known allergies.   Review of Systems Review of Systems  Constitutional: Positive for chills, fatigue and fever.  HENT: Negative for congestion, ear pain, sore throat, trouble swallowing and voice change.   Respiratory: Negative for cough and shortness of breath.   Cardiovascular: Negative for chest pain and palpitations.  Gastrointestinal: Positive for nausea. Negative for abdominal pain, diarrhea and vomiting.  Musculoskeletal: Positive for arthralgias, back pain and myalgias.  Skin: Negative for rash.  Neurological: Positive for dizziness, weakness and light-headedness. Negative for syncope and headaches.  All other systems reviewed  and are negative.    Physical Exam Triage Vital Signs ED Triage Vitals [08/20/20 1117]  Enc Vitals Group     BP (!) 147/97     Pulse Rate 90     Resp 17     Temp (!) 101.3 F (38.5 C)     Temp Source Oral     SpO2 98 %     Weight      Height      Head Circumference      Peak Flow      Pain Score      Pain Loc      Pain Edu?      Excl. in [place]?    Orthostatic VS for the past 24 hrs:  BP- Lying Pulse- Lying BP- Sitting Pulse- Sitting BP- Standing at 0 minutes Pulse- Standing at 0 minutes  08/20/20 1216 117/74 99 106/73 105 104/73 109  08/20/20 1201 117/77 99 -- -- -- --    Updated Vital Signs BP (!) 147/97 (BP Location: Right Arm)   Pulse 100   Temp (!) 100.4 F (38 C) (Oral)   Resp 20   Ht 5\' 6"  (1.676 m)   Wt 215 lb (97.5 kg)   SpO2 99%   BMI 34.70 kg/m    Visual Acuity Right Eye Distance:   Left Eye Distance:   Bilateral Distance:    Right Eye Near:   Left Eye Near:    Bilateral Near:     Physical Exam Vitals and nursing note reviewed.  Constitutional:      General: She is not in acute distress.    Appearance: Normal appearance. She is well-developed. She is not ill-appearing, toxic-appearing or diaphoretic.  HENT:     Head: Normocephalic and atraumatic.     Right Ear: Tympanic membrane and ear canal normal.     Left Ear: Tympanic membrane and ear canal normal.     Nose: Nose normal.     Right Sinus: No maxillary sinus tenderness or frontal sinus tenderness.     Left Sinus: No maxillary sinus tenderness or frontal sinus tenderness.     Mouth/Throat:     Lips: Pink.     Mouth: Mucous membranes are moist.     Pharynx: Oropharynx is clear. Uvula midline. No pharyngeal swelling, oropharyngeal exudate, posterior oropharyngeal erythema or uvula swelling.  Cardiovascular:     Rate and Rhythm: Normal rate and regular rhythm.  Pulmonary:     Effort: Pulmonary effort is normal. No respiratory distress.     Breath sounds: Normal breath sounds. No stridor. No wheezing, rhonchi or rales.  Abdominal:     General: There is no distension.     Palpations: Abdomen is soft.     Tenderness: There is no abdominal tenderness.  Musculoskeletal:        General: Normal range of motion.     Cervical back: Normal range of motion and neck supple. No tenderness.  Lymphadenopathy:     Cervical: No cervical adenopathy.  Skin:    General: Skin is warm and dry.  Neurological:     Mental Status: She is alert and oriented to person, place, and time.  Psychiatric:        Behavior: Behavior normal.      UC Treatments / Results  Labs (all labs ordered are listed, but only abnormal results are displayed) Labs Reviewed  POCT FASTING CBG [facility] MANUAL ENTRY - Abnormal; Notable for the following components:      Result Value  POCT Glucose ([facility]) 149 (*)     All other components within normal limits  NOVEL CORONAVIRUS, NAA  COMPLETE METABOLIC PANEL WITH GFR  POCT CBC W AUTO DIFF ([facility])  POC SARS CORONAVIRUS 2 AG -  ED  TROPONIN I (HIGH SENSITIVITY)    EKG Date/Time:08/20/2020   11:41:35 Ventricular Rate: 106 PR Interval: 172 QRS Duration: 84 QT Interval: 332 QTC Calculation: 441 P-R-T axes: 52   -62   50 Text Interpretation: Sinus tachycardia, Left anterior fascicular block. Abnormal ECG.   No prior to compare.   Radiology No results found.  Procedures Procedures (including critical care time)  Medications Ordered in UC Medications  acetaminophen (TYLENOL) tablet 1,000 mg (1,000 mg Oral Given 08/20/20 1125)  ondansetron (ZOFRAN-ODT) disintegrating tablet 4 mg (4 mg Oral Given 08/20/20 1208)    Initial Impression / Assessment and Plan / UC Course  I have reviewed the triage vital signs and the nursing notes.  Pertinent labs & imaging results that were available during my care of the patient were reviewed by me and considered in my medical decision making (see chart for details).     Hx and exam c/w early viral illness Rapid COVID: NEGATIVE COVID PCR test pending HR, Temp and BP improving after given acetaminophen Discussed ECG with pt, no prior to compare.  Pt denies CP at this time. Declined CXR  Discussed pt with Dr. Assunta Found, due to no prior EKG to compare and reported continued weakness, will order Troponin Discussed symptoms that warrant emergent care in the ED. Pt verbalized understanding and agreement with tx plan AVS given  Final Clinical Impressions(s) / UC Diagnoses   Final diagnoses:  Dizziness and giddiness  Flu-like symptoms  At increased risk of exposure to COVID-19 virus  Left anterior fascicular block     Discharge Instructions      There is a slight abnormality in your ECG called a Left anterior fascicular block.  Patients usually do not have symptoms with this and do  not usually need any intervention. Your symptoms today are likely related to a viral illness, however, because we do not have a prior ECG to compare, a troponin test has been sent to the. You will be notified later today of those results. If abnormal, you will be advised to go to the emergency department immediately. If you develop worsening symptoms between now and hearing from [place], please call 911 or have someone drive you to the hospital if you develop chest pain, trouble breathing, worsening dizziness, or other new concerning symptoms develop.    You may take 500mg  acetaminophen every 4-6 hours or in combination with ibuprofen 400-600mg  every 6-8 hours as needed for pain, inflammation, and fever.  Be sure to well hydrated with clear liquids and get at least 8 hours of sleep at night, preferably more while sick.   Please follow up with family medicine in 1 week if needed.     ED Prescriptions    None     PDMP not reviewed this encounter.   Noe Gens, [place] 08/20/20 1353

## 2020-08-20 NOTE — ED Notes (Signed)
Stat labs picked up

## 2020-08-20 NOTE — Discharge Instructions (Signed)
  There is a slight abnormality in your ECG called a Left anterior fascicular block.  Patients usually do not have symptoms with this and do not usually need any intervention. Your symptoms today are likely related to a viral illness, however, because we do not have a prior ECG to compare, a troponin test has been sent to the. You will be notified later today of those results. If abnormal, you will be advised to go to the emergency department immediately. If you develop worsening symptoms between now and hearing from [place], please call 911 or have someone drive you to the hospital if you develop chest pain, trouble breathing, worsening dizziness, or other new concerning symptoms develop.    You may take 500mg  acetaminophen every 4-6 hours or in combination with ibuprofen 400-600mg  every 6-8 hours as needed for pain, inflammation, and fever.  Be sure to well hydrated with clear liquids and get at least 8 hours of sleep at night, preferably more while sick.   Please follow up with family medicine in 1 week if needed.

## 2020-08-20 NOTE — Telephone Encounter (Signed)
Per Junie Panning, called pt with normal lab results. Covid test pending. Head to ER if sxs worsen. Pt acknowledged.

## 2020-08-23 LAB — NOVEL CORONAVIRUS, NAA: SARS-CoV-2, NAA: NOT DETECTED

## 2020-08-23 LAB — SARS-COV-2, NAA 2 DAY TAT

## 2020-08-24 ENCOUNTER — Other Ambulatory Visit: Payer: Self-pay

## 2020-08-24 ENCOUNTER — Emergency Department: Payer: 59

## 2020-08-24 ENCOUNTER — Emergency Department (INDEPENDENT_AMBULATORY_CARE_PROVIDER_SITE_OTHER)
Admission: EM | Admit: 2020-08-24 | Discharge: 2020-08-24 | Disposition: A | Discharge status: Home / Self Care | Payer: 59 | Source: Home / Self Care

## 2020-08-24 DIAGNOSIS — L03115 Cellulitis of right lower limb: Secondary | ICD-10-CM | POA: Diagnosis not present

## 2020-08-24 DIAGNOSIS — R509 Fever, unspecified: Secondary | ICD-10-CM | POA: Diagnosis not present

## 2020-08-24 DIAGNOSIS — R600 Localized edema: Secondary | ICD-10-CM | POA: Diagnosis not present

## 2020-08-24 DIAGNOSIS — M7989 Other specified soft tissue disorders: Secondary | ICD-10-CM | POA: Diagnosis not present

## 2020-08-24 LAB — POCT CBC W AUTO DIFF (K'VILLE URGENT CARE)

## 2020-08-24 MED ORDER — CLINDAMYCIN HCL 300 MG PO CAPS: 300.0000 mg | ORAL_CAPSULE | Freq: Four times a day (QID) | ORAL | 0 refills | Status: DC

## 2020-08-24 NOTE — ED Triage Notes (Signed)
Pt c/o redness and swelling on RT ankle and leg that started Sat night. Also c/o some nausea. Was seen in UC last Thurs for dizziness, fatigue and temperature. Taking naprosyn and tylenol prn.

## 2020-08-24 NOTE — Discharge Instructions (Signed)
  Please take antibiotics as prescribed and be sure to complete entire course even if you start to feel better to ensure infection does not come back.  Please call your primary care provider today to schedule a follow up appointment this week for recheck of symptoms.   Call 911 or have someone drive you to the hospital if symptoms significantly worsening.

## 2020-08-24 NOTE — ED Provider Notes (Signed)
[name]    CSN: [id number] Arrival date & time: 08/24/20  1033      History   Chief Complaint Chief Complaint  Patient presents with  . Leg Pain    RT  . Ankle Pain    RT    HPI Renee Roberts is a 64 y.o. female.   HPI Renee Roberts is a 64 y.o. female presenting to UC with c/o gradually worsening redness, swelling and pain on Right ankle and lower leg that started 2 days ago. She initially thought it was a bug bite because it was itchy, she has applied cortisone cream without relief.  She was seen at [facility] with flu-like symptoms and has continued to have low-grade fever and chills over the weekend. Denies cough, congestion, chest pain or SOB.  No n/v/d. She has taken naprosyn and tylenol as needed. No hx of clots. No recent surgery or travel.  She does report concern for possible psoriatic arthritis but has not been evaluated for it in the past.    Past Medical History:  Diagnosis Date  . Anemia   . Anxiety   . Depression   . GERD (gastroesophageal reflux disease)     There are no problems to display for this patient.   Past Surgical History:  Procedure Laterality Date  . BREAST SURGERY    . CESAREAN SECTION     x 3  . Fractured Finger Left    fifth finger  . TONSILECTOMY, ADENOIDECTOMY, BILATERAL MYRINGOTOMY AND TUBES      OB History   No obstetric history on file.      Home Medications    Prior to Admission medications   Medication Sig Start Date End Date Taking? Authorizing Provider  clindamycin (CLEOCIN) 300 MG capsule Take 1 capsule (300 mg total) by mouth 4 (four) times daily. X 7 days 08/24/20   Noe Gens, PA-C  olopatadine (PATANOL) 0.1 % ophthalmic solution Place 1 drop into the right eye 2 (two) times daily. For at least 7 days 06/14/19   Noe Gens, PA-C  tobramycin (TOBREX) 0.3 % ophthalmic solution Place 1 drop into the right eye every 4 (four) hours. 06/11/19   Raylene Everts, MD    Family History Family History    Problem Relation Age of Onset  . Pancreatic cancer Maternal Grandmother   . Colon cancer Paternal Grandfather   . Diabetes Mother   . COPD Father   . Hypertension Father   . Esophageal cancer Neg Hx   . Rectal cancer Neg Hx   . Stomach cancer Neg Hx     Social History Social History   Tobacco Use  . Smoking status: Never Smoker  . Smokeless tobacco: Never Used  Vaping Use  . Vaping Use: Never used  Substance Use Topics  . Alcohol use: No  . Drug use: No     Allergies   Patient has no known allergies.   Review of Systems Review of Systems  Constitutional: Positive for chills and fever.  HENT: Negative for congestion, ear pain, sore throat, trouble swallowing and voice change.   Respiratory: Negative for cough and shortness of breath.   Cardiovascular: Negative for chest pain and palpitations.  Gastrointestinal: Negative for abdominal pain, diarrhea, nausea and vomiting.  Musculoskeletal: Positive for joint swelling and myalgias. Negative for arthralgias and back pain.  Skin: Positive for color change and rash.  All other systems reviewed and are negative.    Physical Exam Triage  Vital Signs ED Triage Vitals [08/24/20 1040]  Enc Vitals Group     BP 123/79     Pulse Rate (!) 114     Resp 18     Temp 98.4 F (36.9 C)     Temp Source Oral     SpO2 96 %     Weight      Height      Head Circumference      Peak Flow      Pain Score 5     Pain Loc      Pain Edu?      Excl. in [place]?    No data found.  Updated Vital Signs BP 123/79 (BP Location: Right Arm)   Pulse 98   Temp 98.4 F (36.9 C) (Oral)   Resp 18   SpO2 96%   Visual Acuity Right Eye Distance:   Left Eye Distance:   Bilateral Distance:    Right Eye Near:   Left Eye Near:    Bilateral Near:     Physical Exam Vitals and nursing note reviewed.  Constitutional:      General: She is not in acute distress.    Appearance: Normal appearance. She is well-developed. She is not ill-appearing,  toxic-appearing or diaphoretic.  HENT:     Head: Normocephalic and atraumatic.  Cardiovascular:     Rate and Rhythm: Normal rate and regular rhythm.     Comments: Tachycardia in triage, regular rate and rhythm on exam. Pulmonary:     Effort: Pulmonary effort is normal.  Musculoskeletal:        General: Swelling and tenderness present. Normal range of motion.     Cervical back: Normal range of motion.     Comments: Right lower leg and ankle: mild edema, tenderness to posterior calf and lateral ankle. Full ROM knee and ankle.   Skin:    General: Skin is warm and dry.     Capillary Refill: Capillary refill takes less than 2 seconds.     Findings: Erythema and rash present.       Neurological:     Mental Status: She is alert and oriented to person, place, and time.     Sensory: No sensory deficit.  Psychiatric:        Behavior: Behavior normal.      UC Treatments / Results  Labs (all labs ordered are listed, but only abnormal results are displayed) Labs Reviewed  POCT CBC W AUTO DIFF ([place])    EKG   Radiology US Venous Img Lower Unilateral Right  Result Date: 08/24/2020 CLINICAL DATA:  Right lower extremity swelling and redness. EXAM: RIGHT LOWER EXTREMITY VENOUS DOPPLER ULTRASOUND TECHNIQUE: Gray-scale sonography with compression, as well as color and duplex ultrasound, were performed to evaluate the deep venous system(s) from the level of the common femoral vein through the popliteal and proximal calf veins. COMPARISON:  None. FINDINGS: VENOUS Normal compressibility of the common femoral, superficial femoral, and popliteal veins, as well as the visualized calf veins. Visualized portions of profunda femoral vein and great saphenous vein unremarkable. No filling defects to suggest DVT on grayscale or color Doppler imaging. Doppler waveforms show normal direction of venous flow, normal respiratory plasticity and response to augmentation. Limited views of the  contralateral common femoral vein are unremarkable. OTHER Soft tissue edema noted in the area of redness along the lateral lower leg/ankle region. Limitations: none IMPRESSION: No evidence of right lower extremity DVT. Electronically Signed   By: Randall Hiss  Tery Sanfilippo M.D.   On: 08/24/2020 11:56    Procedures Procedures (including critical care time)  Medications Ordered in UC Medications - No data to display  Initial Impression / Assessment and Plan / UC Course  I have reviewed the triage vital signs and the nursing notes.  Pertinent labs & imaging results that were available during my care of the patient were reviewed by me and considered in my medical decision making (see chart for details).     Reassured pt no DVT Will tx for cellulitis with clindamycin Home care isntructions discussed Encouraged close f/u with PCP this week for recheck of symptoms AVS and work note provided  Final Clinical Impressions(s) / UC Diagnoses   Final diagnoses:  Fever and chills  Cellulitis of right lower leg     Discharge Instructions      Please take antibiotics as prescribed and be sure to complete entire course even if you start to feel better to ensure infection does not come back.  Please call your primary care provider today to schedule a follow up appointment this week for recheck of symptoms.   Call 911 or have someone drive you to the hospital if symptoms significantly worsening.     ED Prescriptions    Medication Sig Dispense Auth. Provider   clindamycin (CLEOCIN) 300 MG capsule Take 1 capsule (300 mg total) by mouth 4 (four) times daily. X 7 days 28 capsule Noe Gens, [place]     PDMP not reviewed this encounter.   Noe Gens, [place] 08/24/20 1238

## 2020-10-06 ENCOUNTER — Encounter: Payer: Self-pay | Admitting: Medical-Surgical

## 2020-10-06 ENCOUNTER — Ambulatory Visit (INDEPENDENT_AMBULATORY_CARE_PROVIDER_SITE_OTHER): Payer: 59 | Admitting: Medical-Surgical

## 2020-10-06 ENCOUNTER — Other Ambulatory Visit: Payer: Self-pay | Admitting: Medical-Surgical

## 2020-10-06 VITALS — BP 119/82 | HR 96 | Temp 97.5°F | Ht 64.5 in | Wt 188.8 lb

## 2020-10-06 DIAGNOSIS — Z114 Encounter for screening for human immunodeficiency virus [HIV]: Secondary | ICD-10-CM | POA: Diagnosis not present

## 2020-10-06 DIAGNOSIS — E669 Obesity, unspecified: Secondary | ICD-10-CM

## 2020-10-06 DIAGNOSIS — Z23 Encounter for immunization: Secondary | ICD-10-CM

## 2020-10-06 DIAGNOSIS — Z1159 Encounter for screening for other viral diseases: Secondary | ICD-10-CM | POA: Diagnosis not present

## 2020-10-06 DIAGNOSIS — Z1231 Encounter for screening mammogram for malignant neoplasm of breast: Secondary | ICD-10-CM

## 2020-10-06 DIAGNOSIS — R7303 Prediabetes: Secondary | ICD-10-CM | POA: Diagnosis not present

## 2020-10-06 DIAGNOSIS — R21 Rash and other nonspecific skin eruption: Secondary | ICD-10-CM | POA: Diagnosis not present

## 2020-10-06 DIAGNOSIS — R000 Tachycardia, unspecified: Secondary | ICD-10-CM | POA: Diagnosis not present

## 2020-10-06 DIAGNOSIS — Z1211 Encounter for screening for malignant neoplasm of colon: Secondary | ICD-10-CM | POA: Diagnosis not present

## 2020-10-06 DIAGNOSIS — R0602 Shortness of breath: Secondary | ICD-10-CM | POA: Diagnosis not present

## 2020-10-06 DIAGNOSIS — Z7689 Persons encountering health services in other specified circumstances: Secondary | ICD-10-CM | POA: Diagnosis not present

## 2020-10-06 MED ORDER — CLOTRIMAZOLE-BETAMETHASONE 1-0.05 % EX CREA: 1.0000 "application " | TOPICAL_CREAM | Freq: Two times a day (BID) | CUTANEOUS | 0 refills | Status: DC

## 2020-10-06 MED FILL — CLOTRIMAZOLE-BETAMETHASONE: 1-0.05 | 14 days supply | Qty: 45 | Fill #0

## 2020-10-06 NOTE — Patient Instructions (Signed)
Pneumococcal Conjugate Vaccine (PCV13): What You Need to Know 1. Why get vaccinated? Pneumococcal conjugate vaccine (PCV13) can prevent pneumococcal disease. Pneumococcal disease refers to any illness caused by pneumococcal bacteria. These bacteria can cause many types of illnesses, including pneumonia, which is an infection of the lungs. Pneumococcal bacteria are one of the most common causes of pneumonia. Besides pneumonia, pneumococcal bacteria can also cause:  Ear infections  Sinus infections  Meningitis (infection of the tissue covering the brain and spinal cord)  Bacteremia (bloodstream infection) Anyone can get pneumococcal disease, but children under 31 years of age, people with certain medical conditions, adults 52 years or older, and cigarette smokers are at the highest risk. Most pneumococcal infections are mild. However, some can result in long-term problems, such as brain damage or hearing loss. Meningitis, bacteremia, and pneumonia caused by pneumococcal disease can be fatal. 2. PCV13 PCV13 protects against 13 types of bacteria that cause pneumococcal disease. Infants and young children usually need 4 doses of pneumococcal conjugate vaccine, at 2, 4, 6, and 34-61 months of age. In some cases, a child might need fewer than 4 doses to complete PCV13 vaccination. A dose of PCV23 vaccine is also recommended for anyone 2 years or older with certain medical conditions if they did not already receive PCV13. This vaccine may be given to adults 9 years or older based on discussions between the patient and health care provider. 3. Talk with your health care provider Tell your vaccine provider if the person getting the vaccine:  Has had an allergic reaction after a previous dose of PCV13, to an earlier pneumococcal conjugate vaccine known as PCV7, or to any vaccine containing diphtheria toxoid (for example, DTaP), or has any severe, life-threatening allergies.  In some cases, your health  care provider may decide to postpone PCV13 vaccination to a future visit. People with minor illnesses, such as a cold, may be vaccinated. People who are moderately or severely ill should usually wait until they recover before getting PCV13. Your health care provider can give you more information. 4. Risks of a vaccine reaction  Redness, swelling, pain, or tenderness where the shot is given, and fever, loss of appetite, fussiness (irritability), feeling tired, headache, and chills can happen after PCV13. Young children may be at increased risk for seizures caused by fever after PCV13 if it is administered at the same time as inactivated influenza vaccine. Ask your health care provider for more information. People sometimes faint after medical procedures, including vaccination. Tell your provider if you feel dizzy or have vision changes or ringing in the ears. As with any medicine, there is a very remote chance of a vaccine causing a severe allergic reaction, other serious injury, or death. 5. What if there is a serious problem? An allergic reaction could occur after the vaccinated person leaves the clinic. If you see signs of a severe allergic reaction (hives, swelling of the face and throat, difficulty breathing, a fast heartbeat, dizziness, or weakness), call 9-1-1 and get the person to the nearest hospital. For other signs that concern you, call your health care provider. Adverse reactions should be reported to the Vaccine Adverse Event Reporting System (VAERS). Your health care provider will usually file this report, or you can do it yourself. Visit the VAERS website at www.vaers.SamedayNews.es or call 407-828-6525. VAERS is only for reporting reactions, and VAERS staff do not give medical advice. 6. The National Vaccine Injury Compensation Program The [employer] Vaccine Injury Compensation Program (VICP) is a Technical [employer]wer  that was created to compensate people who may have been injured by certain  vaccines. Visit the VICP website at GoldCloset.com.ee or call (502) 809-9853 to learn about the program and about filing a claim. There is a time limit to file a claim for compensation. 7. How can I learn more?  Ask your health care provider.  Call your local or state health department.  Contact the Centers for Disease Control and Prevention (CDC): ? Call (567) 188-2867 (1-800-CDC-INFO) or ? Visit CDC's website at http://hunter.com/ Vaccine Information Statement PCV13 Vaccine (09/19/2018) This information is not intended to replace advice given to you by your health care provider. Make sure you discuss any questions you have with your health care provider. Document Revised: 02/26/2019 Document Reviewed: 06/19/2018 Elsevier Patient Education  [place].

## 2020-10-06 NOTE — Progress Notes (Signed)
New Patient Office Visit  Subjective:  Patient ID: Renee Roberts, female    DOB: 03-14-1956  Age: 64 y.o. MRN: [id number]  CC:  Chief Complaint  Patient presents with  . Establish Care    HPI Renee Roberts presents to establish care.  Was seen at [facility] for cellulitis of her right leg last month. 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 She does not have a history of cardiac problems and is very concerned about this. Has been having periodic, unpredictable episodes of shortness of breath and fatigue with no explanation.   Was bitten by a tick on her right ankle years ago. Shortly after that, she developed a rash along the lateral right ankle that has been present since then. She has had it evaluated by previous providers as well as dermatology. She was given a hydrocortisone 1% cream that she has been using, helps with itching but has not helped to resolve it. Notes that the lesions get itchy at times and can be crusty appearing when dry.   Past Medical History:  Diagnosis Date  . Anemia   . Anxiety   . Depression   . GERD (gastroesophageal reflux disease)     Past Surgical History:  Procedure Laterality Date  . BREAST SURGERY    . CESAREAN SECTION     x 3  . Fractured Finger Left    fifth finger  . TONSILECTOMY, ADENOIDECTOMY, BILATERAL MYRINGOTOMY AND TUBES      Family History  Problem Relation Age of Onset  . Pancreatic cancer Maternal Grandmother   . Colon cancer Paternal Grandfather   . Diabetes Mother   . COPD Father   . Hypertension Father   . Esophageal cancer Neg Hx   . Rectal cancer Neg Hx   . Stomach cancer Neg Hx     Social History   Socioeconomic History  . Marital status: Married    Spouse name: Not on file  . Number of children: Not on file  . Years of education: Not on file  . Highest education level: Not on file  Occupational History  . Not on file  Tobacco Use  . Smoking status: Never Smoker  . Smokeless  tobacco: Never Used  Vaping Use  . Vaping Use: Never used  Substance and Sexual Activity  . Alcohol use: No  . Drug use: No  . Sexual activity: Not Currently  Other Topics Concern  . Not on file  Social History Narrative  . Not on file   Social Determinants of Health   Financial Resource Strain:   . Difficulty of Paying Living Expenses: Not on file  Food Insecurity:   . Worried About Charity fundraiser in the Last Year: Not on file  . Ran Out of Food in the Last Year: Not on file  Transportation Needs:   . Lack of Transportation (Medical): Not on file  . Lack of Transportation (Non-Medical): Not on file  Physical Activity:   . Days of Exercise per Week: Not on file  . Minutes of Exercise per Session: Not on file  Stress:   . Feeling of Stress : Not on file  Social Connections:   . Frequency of Communication with Friends and Family: Not on file  . Frequency of Social Gatherings with Friends and Family: Not on file  . Attends Religious Services: Not on file  . Active Member of Clubs or Organizations: Not on file  . Attends  Club or Organization Meetings: Not on file  . Marital Status: Not on file  Intimate Partner Violence:   . Fear of Current or Ex-Partner: Not on file  . Emotionally Abused: Not on file  . Physically Abused: Not on file  . Sexually Abused: Not on file    ROS Review of Systems  Constitutional: Positive for fatigue. Negative for chills, diaphoresis, fever and unexpected weight change.  Respiratory: Positive for shortness of breath. Negative for cough, chest tightness and wheezing.   Cardiovascular: Negative for chest pain, palpitations and leg swelling.  Skin: Positive for rash.  Psychiatric/Behavioral: Positive for dysphoric mood (feels well controlled). Negative for self-injury, sleep disturbance and suicidal ideas. The patient is nervous/anxious (feels well controlled).     Objective:   Today's Vitals: BP 119/82   Pulse 96   Temp (!) 97.5 F  (36.4 C) (Oral)   Ht 5' 4.5" (1.638 m)   Wt 188 lb 12.8 oz (85.6 kg)   SpO2 96%   BMI 31.91 kg/m   Physical Exam Vitals and nursing note reviewed.  Constitutional:      General: She is not in acute distress.    Appearance: Normal appearance.  HENT:     Head: Normocephalic and atraumatic.  Cardiovascular:     Rate and Rhythm: Normal rate and regular rhythm.     Pulses: Normal pulses.     Heart sounds: Normal heart sounds. No murmur heard.  No friction rub. No gallop.   Pulmonary:     Effort: Pulmonary effort is normal. No respiratory distress.     Breath sounds: Normal breath sounds. No wheezing.  Skin:    General: Skin is warm and dry.     Findings: Rash present.  Neurological:     Mental Status: She is alert and oriented to person, place, and time.  Psychiatric:        Mood and Affect: Mood normal.        Behavior: Behavior normal.        Thought Content: Thought content normal.        Judgment: Judgment normal.       Assessment & Plan:   1. Encounter to establish care Reviewed available information and discussed health care concerns with patient.   2. Tachycardia/SOB/fatigue EKG in office- NSR with normal rate and axis. No obvious cardiac symptoms to explain periodic symptoms. Suspect previous EKG readings related to cellulitis infection and associated tachycardia.  - Lipid panel  3. Rash Possible psoriasis but cannot rule out fungal component. Will trial Lotrisone cream BID to see if this helps. If not, advised patient to avoid application of creams and ointments for at least 3 days then return for biopsy. Patient verbalized understanding.   4. Screening for colon cancer Referring to GI for colonoscopy.  - Ambulatory referral to Gastroenterology  5. Encounter for screening mammogram for malignant neoplasm of breast Mammogram ordered.  - MM DIGITAL SCREENING BILATERAL; Future  6. Need for hepatitis C/HIV screening test Discussed screening recommendations.  Patient agreeable so adding to blood work today.  - Hepatitis C antibody - HIV Antibody (routine testing w rflx)  7. Need for vaccination with 13-polyvalent pneumococcal conjugate vaccine Pneumonia vaccine given in office.  - Pneumococcal conjugate vaccine 13-valent IM  8. Prediabetes Hemoglobin A1c today.  - Hemoglobin A1c  11. Class 1 obesity Checking lipid panel today.    Outpatient Encounter Medications as of 10/06/2020  Medication Sig  . hydrocortisone cream 1 % Apply 1 application topically  2 (two) times daily as needed for itching.  . clotrimazole-betamethasone (LOTRISONE) cream Apply 1 application topically 2 (two) times daily.  Marland Kitchen olopatadine (PATANOL) 0.1 % ophthalmic solution Place 1 drop into the right eye 2 (two) times daily. For at least 7 days (Patient not taking: Reported on 10/06/2020)  . [DISCONTINUED] clindamycin (CLEOCIN) 300 MG capsule Take 1 capsule (300 mg total) by mouth 4 (four) times daily. X 7 days (Patient not taking: Reported on 10/06/2020)  . [DISCONTINUED] tobramycin (TOBREX) 0.3 % ophthalmic solution Place 1 drop into the right eye every 4 (four) hours. (Patient not taking: Reported on 10/06/2020)   No facility-administered encounter medications on file as of 10/06/2020.    Follow-up: Return if symptoms worsen or fail to improve.   Clearnce Sorrel, DNP, APRN, FNP-BC [facility]

## 2020-10-07 LAB — HEMOGLOBIN A1C
Hgb A1c MFr Bld: 5.8 % of total Hgb — ABNORMAL HIGH (ref <5.7)
Mean Plasma Glucose: 120 (calc)
eAG (mmol/L): 6.6 (calc)

## 2020-10-07 LAB — HEPATITIS C ANTIBODY
Hepatitis C Ab: NONREACTIVE
SIGNAL TO CUT-OFF: 0.02 (ref <1.00)

## 2020-10-07 LAB — HIV ANTIBODY (ROUTINE TESTING W REFLEX): HIV 1&2 Ab, 4th Generation: NONREACTIVE

## 2020-10-07 LAB — LIPID PANEL
Cholesterol: 214 mg/dL — ABNORMAL HIGH (ref <200)
HDL: 56 mg/dL (ref >=50)
LDL Cholesterol (Calc): 129 mg/dL (calc) — ABNORMAL HIGH
Non-HDL Cholesterol (Calc): 158 mg/dL (calc) — ABNORMAL HIGH (ref <130)
Total CHOL/HDL Ratio: 3.8 (calc) (ref <5.0)
Triglycerides: 172 mg/dL — ABNORMAL HIGH (ref <150)

## 2020-10-09 NOTE — Addendum Note (Signed)
Addended by: Narda Rutherford on: 10/09/2020 08:10 AM   Modules accepted: Orders

## 2020-12-10 ENCOUNTER — Other Ambulatory Visit (HOSPITAL_COMMUNITY): Payer: Self-pay | Admitting: *Deleted

## 2020-12-10 DIAGNOSIS — R3989 Other symptoms and signs involving the genitourinary system: Secondary | ICD-10-CM | POA: Diagnosis not present

## 2020-12-10 DIAGNOSIS — N3000 Acute cystitis without hematuria: Secondary | ICD-10-CM | POA: Diagnosis not present

## 2020-12-10 MED FILL — SULFAMETHOXAZOLE-TMP DS TAB: 800-160 | 7 days supply | Qty: 14 | Fill #0

## 2021-04-28 ENCOUNTER — Other Ambulatory Visit (HOSPITAL_COMMUNITY): Payer: Self-pay

## 2021-04-28 ENCOUNTER — Other Ambulatory Visit: Payer: Self-pay

## 2021-04-28 ENCOUNTER — Emergency Department (INDEPENDENT_AMBULATORY_CARE_PROVIDER_SITE_OTHER)
Admission: EM | Admit: 2021-04-28 | Discharge: 2021-04-28 | Disposition: A | Discharge status: Home / Self Care | Payer: 59 | Source: Home / Self Care

## 2021-04-28 ENCOUNTER — Encounter: Payer: Self-pay | Admitting: *Deleted

## 2021-04-28 DIAGNOSIS — M948X9 Other specified disorders of cartilage, unspecified sites: Secondary | ICD-10-CM | POA: Diagnosis not present

## 2021-04-28 MED ORDER — CIPROFLOXACIN HCL 0.3 % OP SOLN
OPHTHALMIC | 0 refills | Status: DC
  Filled 2021-04-28: qty 5, 7d supply, fill #0

## 2021-04-28 MED ORDER — CIPROFLOXACIN HCL 750 MG PO TABS
750.0000 mg | ORAL_TABLET | Freq: Two times a day (BID) | ORAL | 0 refills | Status: AC
  Filled 2021-04-28: qty 14, 7d supply, fill #0

## 2021-04-28 MED ORDER — CIPROFLOXACIN HCL 0.2 % OT SOLN
0.2000 mL | Freq: Two times a day (BID) | OTIC | 0 refills | Status: DC
  Filled 2021-04-28: qty 14, 7d supply, fill #0

## 2021-04-28 MED ORDER — ONDANSETRON 4 MG PO TBDP
4.0000 mg | ORAL_TABLET | Freq: Three times a day (TID) | ORAL | 0 refills | Status: AC | PRN
  Filled 2021-04-28: qty 15, 5d supply, fill #0

## 2021-04-28 NOTE — ED Provider Notes (Signed)
[facility]  ____________________________________________  Time seen: Approximately 12:39 PM  I have reviewed the triage vital signs and the nursing notes.   HISTORY  Chief Complaint Otalgia   Historian Patient    HPI Renee Roberts is a 65 y.o. female presents to the urgent care with left-sided otalgia for the past 2 weeks.  Patient has had edema and erythema of the on the left external ear.  Patient has had a trace amount of drainage from the external auditory canal.  No prior history of perichondritis.  Patient denies any history of diabetes.  No similar symptoms in the past.   Past Medical History:  Diagnosis Date  . Anemia   . Anxiety   . Depression   . GERD (gastroesophageal reflux disease)      Immunizations up to date:  Yes.     Past Medical History:  Diagnosis Date  . Anemia   . Anxiety   . Depression   . GERD (gastroesophageal reflux disease)     There are no problems to display for this patient.   Past Surgical History:  Procedure Laterality Date  . BREAST SURGERY    . CESAREAN SECTION     x 3  . Fractured Finger Left    fifth finger  . TONSILECTOMY, ADENOIDECTOMY, BILATERAL MYRINGOTOMY AND TUBES      Prior to Admission medications   Medication Sig Start Date End Date Taking? Authorizing Provider  ciprofloxacin (CIPRO) 750 MG tablet Take 1 tablet (750 mg total) by mouth 2 (two) times daily for 7 days. 04/28/21 05/05/21 Yes Vallarie Mare M, PA-C  Ciprofloxacin HCl 0.2 % otic solution Place 0.2 mLs into the left ear 2 (two) times daily for 7 days. 04/28/21 05/05/21 Yes Vallarie Mare M, PA-C  clotrimazole-betamethasone (LOTRISONE) cream APPLY 1 APPLICATION TOPICALLY 2 (TWO) TIMES DAILY. 10/06/20 10/06/21 Yes Samuel Bouche, NP  hydrocortisone cream 1 % Apply 1 application topically 2 (two) times daily as needed for itching.   Yes [provider]  ondansetron (ZOFRAN ODT) 4 MG disintegrating tablet Take 1 tablet (4 mg total) by mouth  every 8 (eight) hours as needed for up to 5 days for nausea or vomiting. 04/28/21 05/03/21 Yes Lannie Fields, PA-C    Allergies Patient has no known allergies.  Family History  Problem Relation Age of Onset  . Pancreatic cancer Maternal Grandmother   . Colon cancer Paternal Grandfather   . Diabetes Mother   . COPD Father   . Hypertension Father   . Esophageal cancer Neg Hx   . Rectal cancer Neg Hx   . Stomach cancer Neg Hx     Social History Social History   Tobacco Use  . Smoking status: Never Smoker  . Smokeless tobacco: Never Used  Vaping Use  . Vaping Use: Never used  Substance Use Topics  . Alcohol use: No  . Drug use: No     Review of Systems  Constitutional: No fever/chills Eyes:  No discharge ENT: Patient has left-sided ear pain. Respiratory: no cough. No SOB/ use of accessory muscles to breath Gastrointestinal:   No nausea, no vomiting.  No diarrhea.  No constipation. Musculoskeletal: Negative for musculoskeletal pain. Skin: Negative for rash, abrasions, lacerations, ecchymosis.    ____________________________________________   PHYSICAL EXAM:  VITAL SIGNS: ED Triage Vitals  Enc Vitals Group     BP 04/28/21 1158 109/71     Pulse Rate 04/28/21 1158 86     Resp 04/28/21 1158 18  Temp 04/28/21 1158 98.6 F (37 C)     Temp Source 04/28/21 1158 Oral     SpO2 04/28/21 1158 98 %     Weight 04/28/21 1156 180 lb (81.6 kg)     Height 04/28/21 1156 5\' 6"  (1.676 m)     Head Circumference --      Peak Flow --      Pain Score 04/28/21 1156 4     Pain Loc --      Pain Edu? --      Excl. in [place]? --      Constitutional: Alert and oriented. Well appearing and in no acute distress. Eyes: Conjunctivae are normal. PERRL. EOMI. Head: Atraumatic. ENT:      Ears: Patient has edema and erythema of the left external ear.  Patient has no significant purulent exudate in left external auditory canal.  Left TM is pearly.  Canal seems largely nonedematous.       Nose: No congestion/rhinnorhea.      Mouth/Throat: Mucous membranes are moist.  Neck: No stridor.  No cervical spine tenderness to palpation. Cardiovascular: Normal rate, regular rhythm. Normal S1 and S2.  Good peripheral circulation. Respiratory: Normal respiratory effort without tachypnea or retractions. Lungs CTAB. Good air entry to the bases with no decreased or absent breath sounds Gastrointestinal: Bowel sounds x 4 quadrants. Soft and nontender to palpation. No guarding or rigidity. No distention. Musculoskeletal: Full range of motion to all extremities. No obvious deformities noted Neurologic:  Normal for age. No gross focal neurologic deficits are appreciated.  Skin:  Skin is warm, dry and intact. No rash noted. Psychiatric: Mood and affect are normal for age. Speech and behavior are normal.   ____________________________________________   LABS (all labs ordered are listed, but only abnormal results are displayed)  Labs Reviewed - No data to display ____________________________________________  EKG   ____________________________________________  RADIOLOGY   No results found.  ____________________________________________    PROCEDURES  Procedure(s) performed:     Procedures     Medications - No data to display   ____________________________________________   INITIAL IMPRESSION / ASSESSMENT AND PLAN / ED COURSE  Pertinent labs & imaging results that were available during my care of the patient were reviewed by me and considered in my medical decision making (see chart for details).      Assessment and plan Perichondritis 65 year old female presents to the urgent care with edema and erythema of the left external auditory canal consistent with perichondritis.  Patient was started on oral ciprofloxacin twice daily and Cipro otic solution.  She was cautioned that if aggressive treatment is not initiated, patient could have permanent edema of the left  external auditory canal.  I cautioned patient that if this worsens at all, she should seek care at a local emergency department for ENT evaluation.  She voiced understanding and has easy access to an emergency department should symptoms change or worsen.     ____________________________________________  FINAL CLINICAL IMPRESSION(S) / ED DIAGNOSES  Final diagnoses:  Perichondritis      NEW MEDICATIONS STARTED DURING THIS VISIT:  ED Discharge Orders         Ordered    ciprofloxacin (CIPRO) 750 MG tablet  2 times daily        04/28/21 1233    Ciprofloxacin HCl 0.2 % otic solution  2 times daily        04/28/21 1233    ondansetron (ZOFRAN ODT) 4 MG disintegrating tablet  Every 8 hours PRN  04/28/21 1233              This chart was dictated using voice recognition software/Dragon. Despite best efforts to proofread, errors can occur which can change the meaning. Any change was purely unintentional.     Lannie Fields, PA-C 04/28/21 1242

## 2021-04-28 NOTE — ED Triage Notes (Signed)
Pt c/o LT ear pain and drainage x 2 wks. Denies fever.

## 2021-04-28 NOTE — Discharge Instructions (Signed)
Take ciprofloxacin twice daily for the next 7 days. Please place 0.2 mL of ciprofloxacin in the left ear twice daily for the next 7 days. You can take Zofran for nausea.

## 2022-01-07 ENCOUNTER — Encounter: Payer: Self-pay | Admitting: Medical-Surgical

## 2022-01-07 ENCOUNTER — Other Ambulatory Visit (HOSPITAL_COMMUNITY): Payer: Self-pay

## 2022-01-07 ENCOUNTER — Ambulatory Visit (INDEPENDENT_AMBULATORY_CARE_PROVIDER_SITE_OTHER): Payer: 59 | Admitting: Medical-Surgical

## 2022-01-07 ENCOUNTER — Other Ambulatory Visit: Payer: Self-pay

## 2022-01-07 ENCOUNTER — Other Ambulatory Visit (HOSPITAL_COMMUNITY)
Admission: RE | Admit: 2022-01-07 | Discharge: 2022-01-07 | Disposition: A | Discharge status: Home / Self Care | Payer: 59 | Source: Ambulatory Visit | Attending: Medical-Surgical | Admitting: Medical-Surgical

## 2022-01-07 VITALS — BP 122/79 | HR 78 | Resp 20 | Ht 66.0 in | Wt 190.8 lb

## 2022-01-07 DIAGNOSIS — R0683 Snoring: Secondary | ICD-10-CM

## 2022-01-07 DIAGNOSIS — Z1211 Encounter for screening for malignant neoplasm of colon: Secondary | ICD-10-CM | POA: Diagnosis not present

## 2022-01-07 DIAGNOSIS — Z124 Encounter for screening for malignant neoplasm of cervix: Secondary | ICD-10-CM | POA: Diagnosis not present

## 2022-01-07 DIAGNOSIS — Z13820 Encounter for screening for osteoporosis: Secondary | ICD-10-CM

## 2022-01-07 DIAGNOSIS — Z0000 Encounter for general adult medical examination without abnormal findings: Secondary | ICD-10-CM | POA: Diagnosis not present

## 2022-01-07 DIAGNOSIS — Z1329 Encounter for screening for other suspected endocrine disorder: Secondary | ICD-10-CM

## 2022-01-07 DIAGNOSIS — R21 Rash and other nonspecific skin eruption: Secondary | ICD-10-CM | POA: Diagnosis not present

## 2022-01-07 DIAGNOSIS — R7303 Prediabetes: Secondary | ICD-10-CM | POA: Diagnosis not present

## 2022-01-07 DIAGNOSIS — Z1231 Encounter for screening mammogram for malignant neoplasm of breast: Secondary | ICD-10-CM | POA: Diagnosis not present

## 2022-01-07 MED ORDER — CLOTRIMAZOLE-BETAMETHASONE 1-0.05 % EX CREA
1.0000 "application " | TOPICAL_CREAM | Freq: Two times a day (BID) | CUTANEOUS | 0 refills | Status: AC
  Filled 2022-01-07 – 2022-02-17 (×2): qty 30, 15d supply, fill #0

## 2022-01-07 MED ORDER — ADVAIR HFA 45-21 MCG/ACT IN AERO
2.0000 | INHALATION_SPRAY | Freq: Two times a day (BID) | RESPIRATORY_TRACT | 12 refills | Status: DC
Start: 2022-01-07 — End: 2022-01-07
  Filled 2022-01-07: qty 1, fill #0

## 2022-01-07 NOTE — Progress Notes (Signed)
HPI: Renee Roberts is a 66 y.o. female who  has a past medical history of Anemia, Anxiety, Depression, and GERD (gastroesophageal reflux disease).  she presents to [facility] today, 01/07/22,  for chief complaint of: Annual physical exam  Dentist: Overdue Eye exam: appointment coming up Exercise: None intentional Diet: limits gluten, foods with MSG, and some lactose Pap smear: doing today Mammogram: ordered today Colon cancer screening: referring today COVID vaccine: done, 1 booster  Concerns: Trouble sleeping- wants to do sleep study  Rash on leg- Lotrisone helped, she thinks it's psoriasis in places. Would like refill on cream.   Would like DEXA scan ordered.   Past medical, surgical, social and family history reviewed:  There are no problems to display for this patient.   Past Surgical History:  Procedure Laterality Date   BREAST SURGERY     CESAREAN SECTION     x 3   Fractured Finger Left    fifth finger   TONSILECTOMY, ADENOIDECTOMY, BILATERAL MYRINGOTOMY AND TUBES      Social History   Tobacco Use   Smoking status: Never   Smokeless tobacco: Never  Substance Use Topics   Alcohol use: No    Family History  Problem Relation Age of Onset   Pancreatic cancer Maternal Grandmother    Colon cancer Paternal Grandfather    Diabetes Mother    COPD Father    Hypertension Father    Esophageal cancer Neg Hx    Rectal cancer Neg Hx    Stomach cancer Neg Hx      Current medication list and allergy/intolerance information reviewed:    Current Outpatient Medications  Medication Sig Dispense Refill   clotrimazole-betamethasone (LOTRISONE) cream Apply 1 application topically 2 (two) times daily. Use for up to 14 consecutive days as needed. 30 g 0   hydrocortisone cream 1 % Apply 1 application topically 2 (two) times daily as needed for itching.     No current facility-administered medications for this visit.    No Known  Allergies    Review of Systems: Constitutional:  No  fever, no chills, No recent illness, No unintentional weight changes. No significant fatigue.  HEENT: No  headache, no vision change, no hearing change, No sore throat, No  sinus pressure Cardiac: No  chest pain, No  pressure, No palpitations, No  Orthopnea Respiratory:  No  shortness of breath. No  Cough Gastrointestinal: No  abdominal pain, No  nausea, No  vomiting,  No  blood in stool, No  diarrhea, No  constipation  Musculoskeletal: No new myalgia/arthralgia Skin: + RLE Rash, No other wounds/concerning lesions Genitourinary: No  incontinence, No  abnormal genital bleeding, No abnormal genital discharge Hem/Onc: No  easy bruising/bleeding, No  abnormal lymph node Endocrine: No cold intolerance,  No heat intolerance. No polyuria/polydipsia/polyphagia  Neurologic: No  weakness, No  dizziness, No  slurred speech/focal weakness/facial droop Psychiatric: No  concerns with depression, No  concerns with anxiety, + chronic sleep problems, No mood problems  Exam:  BP 122/79 (BP Location: Right Arm, Patient Position: Sitting, Cuff Size: Large)    Pulse 78    Resp 20    Ht $R'5\' 6"'JM$  (1.676 m)    Wt 190 lb 12.8 oz (86.5 kg)    SpO2 98%    BMI 30.80 kg/m  Constitutional: VS see above. General Appearance: alert, well-developed, well-nourished, NAD Eyes: Normal lids and conjunctive, non-icteric sclera Ears, Nose, Mouth, Throat: MMM, Normal external inspection ears/nares/mouth/lips/gums. TM normal  bilaterally. Pharynx/tonsils no erythema, no exudate. Nasal mucosa normal.  Neck: No masses, trachea midline. No thyroid enlargement. No tenderness/mass appreciated. No lymphadenopathy Respiratory: Normal respiratory effort. no wheeze, no rhonchi, no rales Cardiovascular: S1/S2 normal, no murmur, no rub/gallop auscultated. RRR. No lower extremity edema. Pedal pulse II/IV bilaterally DP and PT. No carotid bruit or JVD. No abdominal aortic bruit. Gastrointestinal:  Nontender, no masses. No hepatomegaly, no splenomegaly. No hernia appreciated. Bowel sounds normal. Rectal exam deferred.  Musculoskeletal: Gait normal. No clubbing/cyanosis of digits.  Neurological: Normal balance/coordination. No tremor. No cranial nerve deficit on limited exam. Motor and sensation intact and symmetric. Cerebellar reflexes intact.  Skin: warm, dry, intact. + small scaly lesion to the medial right calf near the knee, + erythematous, scaly lesions x 2 on the lateral right ankle/calf. No concerning nevi or subq nodules on limited exam.   Psychiatric: Normal judgment/insight. Normal mood and affect. Oriented x3.   ASSESSMENT/PLAN:   1. Annual physical exam Checking labs as below. Orders placed to update preventative care. Recommend regular dental care. Wellness information provided with AVS. - Lipid panel - COMPLETE METABOLIC PANEL WITH GFR - CBC with Differential/Platelet  2. Osteoporosis screening DEXA scan ordered.  - DG Bone Density; Future  3. Encounter for screening mammogram for malignant neoplasm of breast Mammogram ordered.  - MM 3D SCREEN BREAST BILATERAL; Future  4. Colon cancer screening Referring to GI for colonoscopy.  - Ambulatory referral to Gastroenterology  5. Cervical cancer screening Pap smear with HPV cotesting completed today.  - Cytology - PAP  6. Loud snoring/trouble sleeping Sleep study ordered per patient request - Home sleep test  7. Prediabetes Checking A1c.  - Hemoglobin A1c  8. Thyroid disorder screen Checking TSH. - TSH  9. Rash Improved with the use of Lotrisone last year, refilling per patient request. Lesions appear similar to psoriasis which patient already suspects she has. Notes having a cream at home for psoriasis and will try that on her newest lesion on the inner calf.   Orders Placed This Encounter  Procedures   DG Bone Density   MM 3D SCREEN BREAST BILATERAL   TSH   Lipid panel   COMPLETE METABOLIC PANEL WITH  GFR   CBC with Differential/Platelet   Hemoglobin A1c   Ambulatory referral to Gastroenterology   Home sleep test    Meds ordered this encounter  Medications   DISCONTD: fluticasone-salmeterol (ADVAIR HFA) 45-21 MCG/ACT inhaler    Sig: Inhale 2 puffs into the lungs 2 (two) times daily.    Dispense:  1 each    Refill:  12    Order Specific Question:   Supervising Provider    Answer:   MATTHEWS, CODY [[id number]]   clotrimazole-betamethasone (LOTRISONE) cream    Sig: Apply 1 application topically 2 (two) times daily. Use for up to 14 consecutive days as needed.    Dispense:  30 g    Refill:  0    Order Specific Question:   Supervising Provider    Answer:   Luetta Nutting [[id number]]    Patient Instructions  Preventive Care 15 Years and Older, Female Preventive care refers to lifestyle choices and visits with your health care provider that can promote health and wellness. Preventive care visits are also called wellness exams. What can I expect for my preventive care visit? Counseling Your health care provider may ask you questions about your: Medical history, including: Past medical problems. Family medical history. Pregnancy and menstrual history. History of falls. Current health,  including: Memory and ability to understand (cognition). Emotional well-being. Home life and relationship well-being. Sexual activity and sexual health. Lifestyle, including: Alcohol, nicotine or tobacco, and drug use. Access to firearms. Diet, exercise, and sleep habits. Work and work Statistician. Sunscreen use. Safety issues such as seatbelt and bike helmet use. Physical exam Your health care provider will check your: Height and weight. These may be used to calculate your BMI (body mass index). BMI is a measurement that tells if you are at a healthy weight. Waist circumference. This measures the distance around your waistline. This measurement also tells if you are at a healthy weight and may help  predict your risk of certain diseases, such as type 2 diabetes and high blood pressure. Heart rate and blood pressure. Body temperature. Skin for abnormal spots. What immunizations do I need? Vaccines are usually given at various ages, according to a schedule. Your health care provider will recommend vaccines for you based on your age, medical history, and lifestyle or other factors, such as travel or where you work. What tests do I need? Screening Your health care provider may recommend screening tests for certain conditions. This may include: Lipid and cholesterol levels. Hepatitis C test. Hepatitis B test. HIV (human immunodeficiency virus) test. STI (sexually transmitted infection) testing, if you are at risk. Lung cancer screening. Colorectal cancer screening. Diabetes screening. This is done by checking your blood sugar (glucose) after you have not eaten for a while (fasting). Mammogram. Talk with your health care provider about how often you should have regular mammograms. BRCA-related cancer screening. This may be done if you have a family history of breast, ovarian, tubal, or peritoneal cancers. Bone density scan. This is done to screen for osteoporosis. Talk with your health care provider about your test results, treatment options, and if necessary, the need for more tests. Follow these instructions at home: Eating and drinking  Eat a diet that includes fresh fruits and vegetables, whole grains, lean protein, and low-fat dairy products. Limit your intake of foods with high amounts of sugar, saturated fats, and salt. Take vitamin and mineral supplements as recommended by your health care provider. Do not drink alcohol if your health care provider tells you not to drink. If you drink alcohol: Limit how much you have to 0-1 drink a day. Know how much alcohol is in your drink. In the [place] one drink equals one 12 oz bottle of beer (355 mL), one 5 oz glass of wine (148 mL), or one  1 oz glass of hard liquor (44 mL). Lifestyle Brush your teeth every morning and night with fluoride toothpaste. Floss one time each day. Exercise for at least 30 minutes 5 or more days each week. Do not use any products that contain nicotine or tobacco. These products include cigarettes, chewing tobacco, and vaping devices, such as e-cigarettes. If you need help quitting, ask your health care provider. Do not use drugs. If you are sexually active, practice safe sex. Use a condom or other form of protection in order to prevent STIs. Take aspirin only as told by your health care provider. Make sure that you understand how much to take and what form to take. Work with your health care provider to find out whether it is safe and beneficial for you to take aspirin daily. Ask your health care provider if you need to take a cholesterol-lowering medicine (statin). Find healthy ways to manage stress, such as: Meditation, yoga, or listening to music. Journaling. Talking to a trusted  person. Spending time with friends and family. Minimize exposure to UV radiation to reduce your risk of skin cancer. Safety Always wear your seat belt while driving or riding in a vehicle. Do not drive: If you have been drinking alcohol. Do not ride with someone who has been drinking. When you are tired or distracted. While texting. If you have been using any mind-altering substances or drugs. Wear a helmet and other protective equipment during sports activities. If you have firearms in your house, make sure you follow all gun safety procedures. What's next? Visit your health care provider once a year for an annual wellness visit. Ask your health care provider how often you should have your eyes and teeth checked. Stay up to date on all vaccines. This information is not intended to replace advice given to you by your health care provider. Make sure you discuss any questions you have with your health care  provider. Document Revised: 05/05/2021 Document Reviewed: 05/05/2021 Elsevier Patient Education  [place].   Follow-up plan: Return in about 1 year (around 01/07/2023) for annual physical exam.  Clearnce Sorrel, DNP, APRN, FNP-BC [facility]

## 2022-01-07 NOTE — Patient Instructions (Signed)
Preventive Care 65 Years and Older, Female °Preventive care refers to lifestyle choices and visits with your health care provider that can promote health and wellness. Preventive care visits are also called wellness exams. °What can I expect for my preventive care visit? °Counseling °Your health care provider may ask you questions about your: °Medical history, including: °Past medical problems. °Family medical history. °Pregnancy and menstrual history. °History of falls. °Current health, including: °Memory and ability to understand (cognition). °Emotional well-being. °Home life and relationship well-being. °Sexual activity and sexual health. °Lifestyle, including: °Alcohol, nicotine or tobacco, and drug use. °Access to firearms. °Diet, exercise, and sleep habits. °Work and work environment. °Sunscreen use. °Safety issues such as seatbelt and bike helmet use. °Physical exam °Your health care provider will check your: °Height and weight. These may be used to calculate your BMI (body mass index). BMI is a measurement that tells if you are at a healthy weight. °Waist circumference. This measures the distance around your waistline. This measurement also tells if you are at a healthy weight and may help predict your risk of certain diseases, such as type 2 diabetes and high blood pressure. °Heart rate and blood pressure. °Body temperature. °Skin for abnormal spots. °What immunizations do I need? °Vaccines are usually given at various ages, according to a schedule. Your health care provider will recommend vaccines for you based on your age, medical history, and lifestyle or other factors, such as travel or where you work. °What tests do I need? °Screening °Your health care provider may recommend screening tests for certain conditions. This may include: °Lipid and cholesterol levels. °Hepatitis C test. °Hepatitis B test. °HIV (human immunodeficiency virus) test. °STI (sexually transmitted infection) testing, if you are at  risk. °Lung cancer screening. °Colorectal cancer screening. °Diabetes screening. This is done by checking your blood sugar (glucose) after you have not eaten for a while (fasting). °Mammogram. Talk with your health care provider about how often you should have regular mammograms. °BRCA-related cancer screening. This may be done if you have a family history of breast, ovarian, tubal, or peritoneal cancers. °Bone density scan. This is done to screen for osteoporosis. °Talk with your health care provider about your test results, treatment options, and if necessary, the need for more tests. °Follow these instructions at home: °Eating and drinking ° °Eat a diet that includes fresh fruits and vegetables, whole grains, lean protein, and low-fat dairy products. Limit your intake of foods with high amounts of sugar, saturated fats, and salt. °Take vitamin and mineral supplements as recommended by your health care provider. °Do not drink alcohol if your health care provider tells you not to drink. °If you drink alcohol: °Limit how much you have to 0-1 drink a day. °Know how much alcohol is in your drink. In the [place] one drink equals one 12 oz bottle of beer (355 mL), one 5 oz glass of wine (148 mL), or one 1½ oz glass of hard liquor (44 mL). °Lifestyle °Brush your teeth every morning and night with fluoride toothpaste. Floss one time each day. °Exercise for at least 30 minutes 5 or more days each week. °Do not use any products that contain nicotine or tobacco. These products include cigarettes, chewing tobacco, and vaping devices, such as e-cigarettes. If you need help quitting, ask your health care provider. °Do not use drugs. °If you are sexually active, practice safe sex. Use a condom or other form of protection in order to prevent STIs. °Take aspirin only as told by your   health care provider. Make sure that you understand how much to take and what form to take. Work with your health care provider to find out whether it  is safe and beneficial for you to take aspirin daily. Ask your health care provider if you need to take a cholesterol-lowering medicine (statin). Find healthy ways to manage stress, such as: Meditation, yoga, or listening to music. Journaling. Talking to a trusted person. Spending time with friends and family. Minimize exposure to UV radiation to reduce your risk of skin cancer. Safety Always wear your seat belt while driving or riding in a vehicle. Do not drive: If you have been drinking alcohol. Do not ride with someone who has been drinking. When you are tired or distracted. While texting. If you have been using any mind-altering substances or drugs. Wear a helmet and other protective equipment during sports activities. If you have firearms in your house, make sure you follow all gun safety procedures. What's next? Visit your health care provider once a year for an annual wellness visit. Ask your health care provider how often you should have your eyes and teeth checked. Stay up to date on all vaccines. This information is not intended to replace advice given to you by your health care provider. Make sure you discuss any questions you have with your health care provider. Document Revised: 05/05/2021 Document Reviewed: 05/05/2021 Elsevier Patient Education  [place].

## 2022-01-10 ENCOUNTER — Other Ambulatory Visit (HOSPITAL_COMMUNITY): Payer: Self-pay

## 2022-01-12 ENCOUNTER — Ambulatory Visit (INDEPENDENT_AMBULATORY_CARE_PROVIDER_SITE_OTHER): Payer: 59

## 2022-01-12 ENCOUNTER — Other Ambulatory Visit: Payer: Self-pay

## 2022-01-12 DIAGNOSIS — Z1231 Encounter for screening mammogram for malignant neoplasm of breast: Secondary | ICD-10-CM | POA: Diagnosis not present

## 2022-01-12 DIAGNOSIS — Z13820 Encounter for screening for osteoporosis: Secondary | ICD-10-CM | POA: Diagnosis not present

## 2022-01-12 DIAGNOSIS — M85852 Other specified disorders of bone density and structure, left thigh: Secondary | ICD-10-CM | POA: Diagnosis not present

## 2022-01-13 LAB — CYTOLOGY - PAP
Comment: NEGATIVE
Diagnosis: NEGATIVE
High risk HPV: NEGATIVE

## 2022-01-19 ENCOUNTER — Other Ambulatory Visit (HOSPITAL_COMMUNITY): Payer: Self-pay

## 2022-01-28 DIAGNOSIS — H524 Presbyopia: Secondary | ICD-10-CM | POA: Diagnosis not present

## 2022-01-28 DIAGNOSIS — H52221 Regular astigmatism, right eye: Secondary | ICD-10-CM | POA: Diagnosis not present

## 2022-01-28 DIAGNOSIS — H5203 Hypermetropia, bilateral: Secondary | ICD-10-CM | POA: Diagnosis not present

## 2022-02-02 ENCOUNTER — Other Ambulatory Visit: Payer: 59

## 2022-02-17 ENCOUNTER — Other Ambulatory Visit (HOSPITAL_COMMUNITY): Payer: Self-pay

## 2022-05-25 ENCOUNTER — Emergency Department (INDEPENDENT_AMBULATORY_CARE_PROVIDER_SITE_OTHER)
Admission: EM | Admit: 2022-05-25 | Discharge: 2022-05-25 | Disposition: A | Discharge status: Home / Self Care | Payer: 59 | Source: Home / Self Care

## 2022-05-25 ENCOUNTER — Encounter: Payer: Self-pay | Admitting: Emergency Medicine

## 2022-05-25 DIAGNOSIS — N3001 Acute cystitis with hematuria: Secondary | ICD-10-CM | POA: Diagnosis not present

## 2022-05-25 LAB — POCT URINALYSIS DIP (MANUAL ENTRY)
Bilirubin, UA: NEGATIVE
Glucose, UA: NEGATIVE mg/dL
Ketones, POC UA: NEGATIVE mg/dL
Nitrite, UA: NEGATIVE
Protein Ur, POC: >=300 mg/dL — AB
Spec Grav, UA: >=1.03 — AB (ref 1.010–1.025)
Urobilinogen, UA: 0.2 E.U./dL
pH, UA: 5.5 (ref 5.0–8.0)

## 2022-05-25 MED ORDER — SULFAMETHOXAZOLE-TRIMETHOPRIM 800-160 MG PO TABS: 1.0000 | ORAL_TABLET | Freq: Two times a day (BID) | ORAL | 0 refills | Status: AC

## 2022-05-25 NOTE — Discharge Instructions (Addendum)
Instructed patient to take medication as directed with food to completion.  Encourage patient increase daily water intake while taking this medication.  Advised patient we will follow-up with urine culture results once received.  Advised if symptoms worsen and/or unresolved please follow-up with PCP or here for further evaluation.

## 2022-05-25 NOTE — ED Provider Notes (Signed)
[name]    CSN: [id number] Arrival date & time: 05/25/22  1221      History   Chief Complaint Chief Complaint  Patient presents with   Dysuria    HPI Renee Roberts is a 66 y.o. female.   HPI Pleasant 67--year-old female presents with dysuria, urgency, and frequency for 1 week.  Patient reports taking OTC AZO every 4 hours for pain. PMH significant for obesity, depression, and GERD.  Past Medical History:  Diagnosis Date   Anemia    Anxiety    Depression    GERD (gastroesophageal reflux disease)     There are no problems to display for this patient.   Past Surgical History:  Procedure Laterality Date   CESAREAN SECTION     x 3   Fractured Finger Left    fifth finger   REDUCTION MAMMAPLASTY Bilateral 2018   TONSILECTOMY, ADENOIDECTOMY, BILATERAL MYRINGOTOMY AND TUBES      OB History   No obstetric history on file.      Home Medications    Prior to Admission medications   Medication Sig Start Date End Date Taking? Authorizing Provider  sulfamethoxazole-trimethoprim (BACTRIM DS) 800-160 MG tablet Take 1 tablet by mouth 2 (two) times daily for 7 days. 05/25/22 06/01/22 Yes Eliezer Lofts, FNP  clotrimazole-betamethasone (LOTRISONE) cream Apply 1 application topically 2 (two) times daily. Use for up to 14 consecutive days as needed. 01/07/22   Samuel Bouche, NP  hydrocortisone cream 1 % Apply 1 application topically 2 (two) times daily as needed for itching.    [provider]    Family History Family History  Problem Relation Age of Onset   Pancreatic cancer Maternal Grandmother    Colon cancer Paternal Grandfather    Diabetes Mother    COPD Father    Hypertension Father    Esophageal cancer Neg Hx    Rectal cancer Neg Hx    Stomach cancer Neg Hx     Social History Social History   Tobacco Use   Smoking status: Never   Smokeless tobacco: Never  Vaping Use   Vaping Use: Never used  Substance Use Topics   Alcohol use: No    Drug use: No     Allergies   Patient has no known allergies.   Review of Systems Review of Systems  Genitourinary:  Positive for dysuria, frequency and urgency.  All other systems reviewed and are negative.    Physical Exam Triage Vital Signs ED Triage Vitals  Enc Vitals Group     BP      Pulse      Resp      Temp      Temp src      SpO2      Weight      Height      Head Circumference      Peak Flow      Pain Score      Pain Loc      Pain Edu?      Excl. in [place]?    No data found.  Updated Vital Signs BP 113/72 (BP Location: Right Arm)   Pulse (!) 102   Temp 98.6 F (37 C) (Oral)   Resp 18   Ht '5\' 6"'$  (1.676 m)   Wt 190 lb (86.2 kg)   SpO2 94%   BMI 30.67 kg/m    Physical Exam Vitals and nursing note reviewed.  Constitutional:      General: She  is not in acute distress.    Appearance: Normal appearance. She is obese. She is not ill-appearing.  HENT:     Head: Normocephalic and atraumatic.     Mouth/Throat:     Mouth: Mucous membranes are moist.     Pharynx: Oropharynx is clear.  Eyes:     Extraocular Movements: Extraocular movements intact.     Conjunctiva/sclera: Conjunctivae normal.     Pupils: Pupils are equal, round, and reactive to light.  Cardiovascular:     Rate and Rhythm: Normal rate and regular rhythm.     Pulses: Normal pulses.     Heart sounds: Normal heart sounds. No murmur heard. Pulmonary:     Effort: Pulmonary effort is normal.     Breath sounds: Normal breath sounds. No wheezing, rhonchi or rales.  Abdominal:     Tenderness: There is no right CVA tenderness or left CVA tenderness.  Musculoskeletal:     Cervical back: Normal range of motion and neck supple.  Skin:    General: Skin is warm and dry.  Neurological:     General: No focal deficit present.     Mental Status: She is alert and oriented to person, place, and time.      UC Treatments / Results  Labs (all labs ordered are listed, but only abnormal results are  displayed) Labs Reviewed  POCT URINALYSIS DIP (MANUAL ENTRY) - Abnormal; Notable for the following components:      Result Value   Clarity, UA cloudy (*)    Spec Grav, UA >=1.030 (*)    Blood, UA large (*)    Protein Ur, POC >=300 (*)    Leukocytes, UA Moderate (2+) (*)    All other components within normal limits  URINE CULTURE    EKG   Radiology No results found.  Procedures Procedures (including critical care time)  Medications Ordered in UC Medications - No data to display  Initial Impression / Assessment and Plan / UC Course  I have reviewed the triage vital signs and the nursing notes.  Pertinent labs & imaging results that were available during my care of the patient were reviewed by me and considered in my medical decision making (see chart for details).     MDM: 1.  Acute cystitis with hematuria-Rx'd Bactrim. Instructed patient to take medication as directed with food to completion.  Encourage patient increase daily water intake while taking this medication.  Advised patient we will follow-up with urine culture results once received. Advised if symptoms worsen and/or unresolved please follow-up with PCP or here for further evaluation.  Patient discharged home, hemodynamically stable. Final Clinical Impressions(s) / UC Diagnoses   Final diagnoses:  Acute cystitis with hematuria     Discharge Instructions      Instructed patient to take medication as directed with food to completion.  Encourage patient increase daily water intake while taking this medication.  Advised patient we will follow-up with urine culture results once received.  Advised if symptoms worsen and/or unresolved please follow-up with PCP or here for further evaluation.     ED Prescriptions     Medication Sig Dispense Auth. Provider   sulfamethoxazole-trimethoprim (BACTRIM DS) 800-160 MG tablet Take 1 tablet by mouth 2 (two) times daily for 7 days. 14 tablet Eliezer Lofts, FNP      PDMP  not reviewed this encounter.   Eliezer Lofts, [place] 05/25/22 1323

## 2022-05-25 NOTE — ED Triage Notes (Signed)
Patient c/o dysuria, urgency and frequency x 1 week.  Patient has been taking OTC AZO for pain every 4 hours.

## 2022-05-27 LAB — URINE CULTURE
MICRO NUMBER:: 13606581
SPECIMEN QUALITY:: ADEQUATE

## 2022-08-08 ENCOUNTER — Telehealth: Payer: Self-pay | Admitting: Emergency Medicine

## 2022-08-08 ENCOUNTER — Ambulatory Visit (INDEPENDENT_AMBULATORY_CARE_PROVIDER_SITE_OTHER): Payer: 59

## 2022-08-08 ENCOUNTER — Ambulatory Visit
Admission: EM | Admit: 2022-08-08 | Discharge: 2022-08-08 | Disposition: A | Discharge status: Home / Self Care | Payer: 59 | Attending: Family Medicine | Admitting: Family Medicine

## 2022-08-08 ENCOUNTER — Encounter: Payer: Self-pay | Admitting: Emergency Medicine

## 2022-08-08 DIAGNOSIS — R5383 Other fatigue: Secondary | ICD-10-CM

## 2022-08-08 DIAGNOSIS — Z6830 Body mass index (BMI) 30.0-30.9, adult: Secondary | ICD-10-CM | POA: Insufficient documentation

## 2022-08-08 DIAGNOSIS — U071 COVID-19: Secondary | ICD-10-CM | POA: Insufficient documentation

## 2022-08-08 DIAGNOSIS — E669 Obesity, unspecified: Secondary | ICD-10-CM | POA: Diagnosis not present

## 2022-08-08 DIAGNOSIS — R059 Cough, unspecified: Secondary | ICD-10-CM | POA: Insufficient documentation

## 2022-08-08 LAB — RESP PANEL BY RT-PCR (RSV, FLU A&B, COVID)  RVPGX2
Influenza A by PCR: NEGATIVE
Influenza B by PCR: NEGATIVE
Resp Syncytial Virus by PCR: NEGATIVE
SARS Coronavirus 2 by RT PCR: POSITIVE — AB

## 2022-08-08 NOTE — Discharge Instructions (Addendum)
Advised patient of chest x-ray results with hard copy provided to patient.  Patient declined antibiotic/cough therapy today.  Advised will wait on lab test for COVID-19/influenza/RSV.

## 2022-08-08 NOTE — ED Provider Notes (Signed)
[facility]    CSN: [id number] Arrival date & time: 08/08/22  0807      History   Chief Complaint Chief Complaint  Patient presents with   Cough    HPI Renee Roberts is a 66 y.o. female.   HPI 66 year old female presents with cough, nasal congestion, headache for 1 week.  Patient reports that her husband tested positive for COVID 44 yesterday.  PMH significant for obesity, anemia, and anxiety.  Past Medical History:  Diagnosis Date   Anemia    Anxiety    Depression    GERD (gastroesophageal reflux disease)     There are no problems to display for this patient.   Past Surgical History:  Procedure Laterality Date   CESAREAN SECTION     x 3   Fractured Finger Left    fifth finger   REDUCTION MAMMAPLASTY Bilateral 2018   TONSILECTOMY, ADENOIDECTOMY, BILATERAL MYRINGOTOMY AND TUBES      OB History   No obstetric history on file.      Home Medications    Prior to Admission medications   Medication Sig Start Date End Date Taking? Authorizing Provider  clotrimazole-betamethasone (LOTRISONE) cream Apply 1 application topically 2 (two) times daily. Use for up to 14 consecutive days as needed. 01/07/22  Yes Samuel Bouche, NP  hydrocortisone cream 1 % Apply 1 application topically 2 (two) times daily as needed for itching.   Yes [provider]    Family History Family History  Problem Relation Age of Onset   Pancreatic cancer Maternal Grandmother    Colon cancer Paternal Grandfather    Diabetes Mother    COPD Father    Hypertension Father    Esophageal cancer Neg Hx    Rectal cancer Neg Hx    Stomach cancer Neg Hx     Social History Social History   Tobacco Use   Smoking status: Never   Smokeless tobacco: Never  Vaping Use   Vaping Use: Never used  Substance Use Topics   Alcohol use: No   Drug use: No     Allergies   Patient has no known allergies.   Review of Systems Review of Systems  HENT:  Positive for congestion.    Respiratory:  Positive for cough.   Neurological:  Positive for headaches.  All other systems reviewed and are negative.    Physical Exam Triage Vital Signs ED Triage Vitals  Enc Vitals Group     BP 08/08/22 0821 132/79     Pulse Rate 08/08/22 0821 71     Resp 08/08/22 0821 18     Temp 08/08/22 0821 98.7 F (37.1 C)     Temp Source 08/08/22 0821 Oral     SpO2 08/08/22 0821 97 %     Weight 08/08/22 0823 190 lb (86.2 kg)     Height 08/08/22 0823 '5\' 6"'$  (1.676 m)     Head Circumference --      Peak Flow --      Pain Score 08/08/22 0823 0     Pain Loc --      Pain Edu? --      Excl. in [place]? --    No data found.  Updated Vital Signs BP 132/79 (BP Location: Right Arm)   Pulse 71   Temp 98.7 F (37.1 C) (Oral)   Resp 18   Ht '5\' 6"'$  (1.676 m)   Wt 190 lb (86.2 kg)   SpO2 97%   BMI 30.67  kg/m      Physical Exam Vitals and nursing note reviewed.  Constitutional:      Appearance: Normal appearance. She is normal weight. She is ill-appearing.  HENT:     Head: Normocephalic and atraumatic.     Right Ear: External ear normal.     Left Ear: External ear normal.     Ears:     Comments: Moderate eustachian tube dysfunction noted bilaterally; bilateral TM's, clear, retracted with trace serous effusions noted    Mouth/Throat:     Mouth: Mucous membranes are moist.     Pharynx: Oropharynx is clear.  Eyes:     Extraocular Movements: Extraocular movements intact.     Conjunctiva/sclera: Conjunctivae normal.     Pupils: Pupils are equal, round, and reactive to light.  Cardiovascular:     Rate and Rhythm: Normal rate and regular rhythm.     Pulses: Normal pulses.     Heart sounds: Normal heart sounds.  Pulmonary:     Effort: Pulmonary effort is normal.     Breath sounds: No wheezing, rhonchi or rales.     Comments: Diminished breath sounds at bases, infrequent nonproductive cough noted on exam Abdominal:     General: Bowel sounds are normal.  Musculoskeletal:     Cervical  back: Normal range of motion and neck supple.  Skin:    General: Skin is warm and dry.  Neurological:     General: No focal deficit present.     Mental Status: She is alert and oriented to person, place, and time. Mental status is at baseline.      UC Treatments / Results  Labs (all labs ordered are listed, but only abnormal results are displayed) Labs Reviewed  RESP PANEL BY RT-PCR (RSV, FLU A&B, COVID)  [id number]    EKG   Radiology DG Chest 2 View  Result Date: 08/08/2022 CLINICAL DATA:  Cough, fatigue for 1 week EXAM: CHEST - 2 VIEW COMPARISON:  None Available. FINDINGS: The heart size and mediastinal contours are within normal limits. Both lungs are clear. The visualized skeletal structures are unremarkable. IMPRESSION: No active cardiopulmonary disease. Electronically Signed   By: Kathreen Devoid M.D.   On: 08/08/2022 09:00    Procedures Procedures (including critical care time)  Medications Ordered in UC Medications - No data to display  Initial Impression / Assessment and Plan / UC Course  I have reviewed the triage vital signs and the nursing notes.  Pertinent labs & imaging results that were available during my care of the patient were reviewed by me and considered in my medical decision making (see chart for details).     MDM: 1.  Cough-CXR revealed above, Advised patient of chest x-ray results with hard copy provided to patient.  Patient declined antibiotic/cough therapy today.  Advised will wait on lab test for COVID-19/influenza/RSV.  Discharged home, hemodynamically stable. Final Clinical Impressions(s) / UC Diagnoses   Final diagnoses:  Cough, unspecified type     Discharge Instructions      Advised patient of chest x-ray results with hard copy provided to patient.  Patient declined antibiotic/cough therapy today.  Advised will wait on lab test for COVID-19/influenza/RSV.     ED Prescriptions   None    PDMP not reviewed this encounter.   Eliezer Lofts, [place] 08/08/22 1015

## 2022-08-08 NOTE — ED Triage Notes (Signed)
Patient c/o cough, nasal congestion, headache x 1 week.  Patient's husband tested positive for COVID yesterday.  Patient has taken Aleve, Omeprazole.

## 2022-08-08 NOTE — Telephone Encounter (Signed)
Patient informed of positive COVID test.  Patient will self quarantine x 5 days per Le[facility] [place].  Note for work will be provided via Pharmacist, community.  Patient voices understanding.

## 2022-08-09 ENCOUNTER — Other Ambulatory Visit (HOSPITAL_COMMUNITY): Payer: Self-pay

## 2022-08-09 ENCOUNTER — Telehealth: Payer: Self-pay

## 2022-08-09 MED ORDER — MOLNUPIRAVIR EUA 200MG CAPSULE
4.0000 | ORAL_CAPSULE | Freq: Two times a day (BID) | ORAL | 0 refills | Status: AC
  Filled 2022-08-09: qty 40, 5d supply, fill #0

## 2022-08-09 NOTE — Telephone Encounter (Signed)
Patient requested Molupniviar be sent to her requested pharmacy.  Medication sent per patient request.

## 2022-09-14 ENCOUNTER — Ambulatory Visit (AMBULATORY_SURGERY_CENTER): Payer: Self-pay

## 2022-09-14 ENCOUNTER — Other Ambulatory Visit (HOSPITAL_COMMUNITY): Payer: Self-pay

## 2022-09-14 VITALS — Ht 66.0 in | Wt 173.0 lb

## 2022-09-14 DIAGNOSIS — Z1211 Encounter for screening for malignant neoplasm of colon: Secondary | ICD-10-CM

## 2022-09-14 MED ORDER — NA SULFATE-K SULFATE-MG SULF 17.5-3.13-1.6 GM/177ML PO SOLN
ORAL | 0 refills | Status: DC
  Filled 2022-09-14: qty 354, 1d supply, fill #0

## 2022-09-14 NOTE — Progress Notes (Signed)
No egg or soy allergy known to patient  No issues known to pt with past sedation with any surgeries or procedures Patient denies ever being told they had issues or difficulty with intubation  No FH of Malignant Hyperthermia Pt is not on diet pills Pt is not on  home 02  Pt is not on blood thinners  Pt reports periodic issues with constipation  No A fib or A flutter Have any cardiac testing pending--denied Pt instructed to use [employer] or GoodRx for a price reduction on prep   Patient's chart reviewed by Osvaldo Angst CNRA prior to previsit and patient appropriate for the [place].  Previsit completed and red dot placed by patient's name on their procedure day (on provider's schedule).

## 2022-09-15 IMAGING — MG MM DIGITAL SCREENING BILAT W/ TOMO AND CAD
8 series · 8 of 24 positions shown · non-contrast
Comparison: Previous exam(s).

CLINICAL DATA: Screening.

EXAM:
DIGITAL SCREENING BILATERAL MAMMOGRAM WITH TOMOSYNTHESIS AND CAD
TECHNIQUE: Bilateral screening digital craniocaudal and mediolateral oblique
mammograms were obtained. Bilateral screening digital breast
tomosynthesis was performed. The images were evaluated with
computer-aided detection.

[R MLO synth-2D]
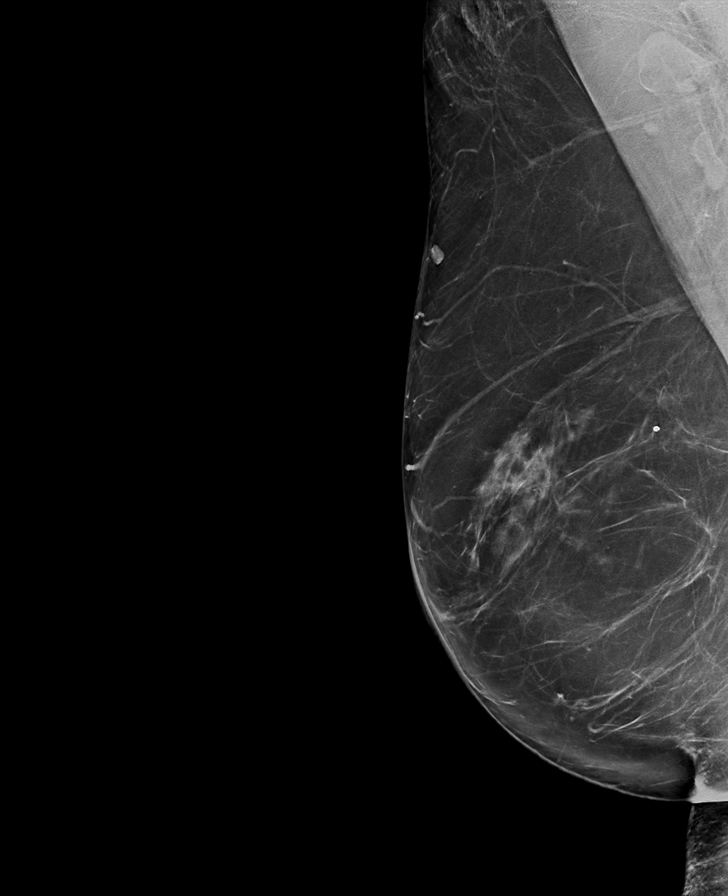

[R CC synth-2D]
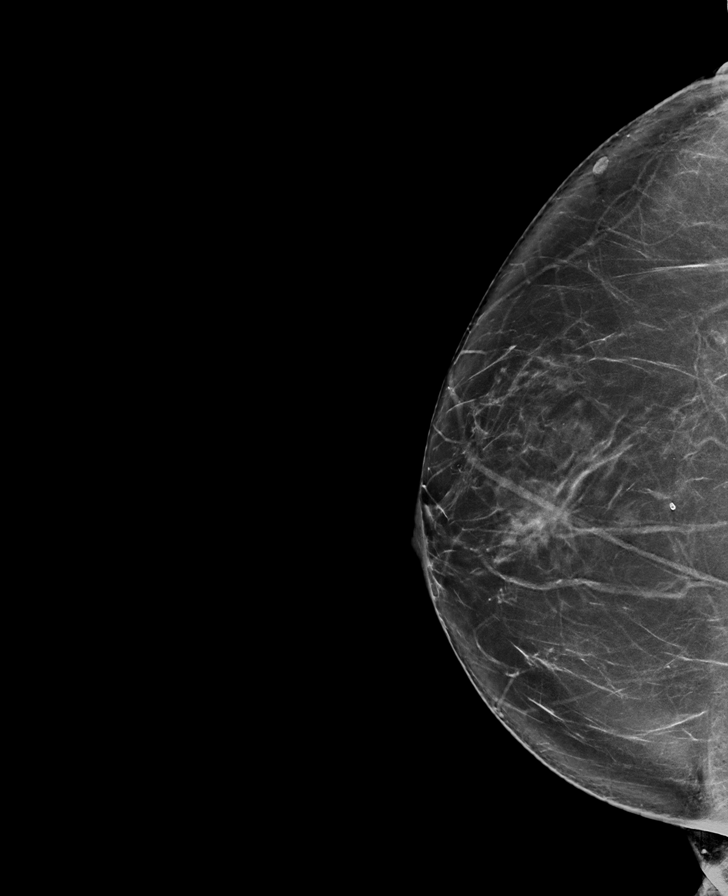

[L MLO synth-2D]
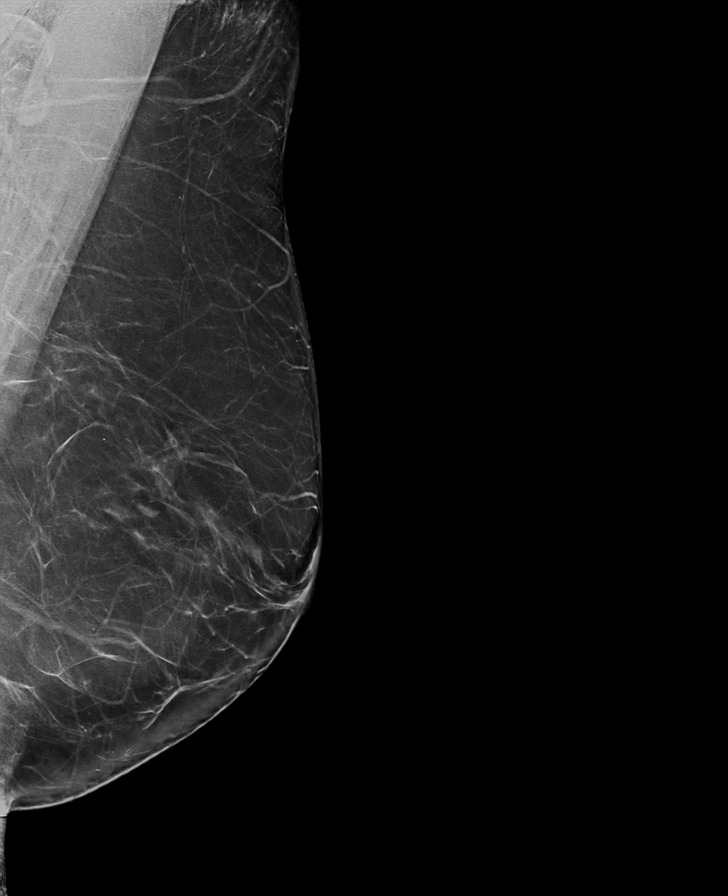

[L CC synth-2D]
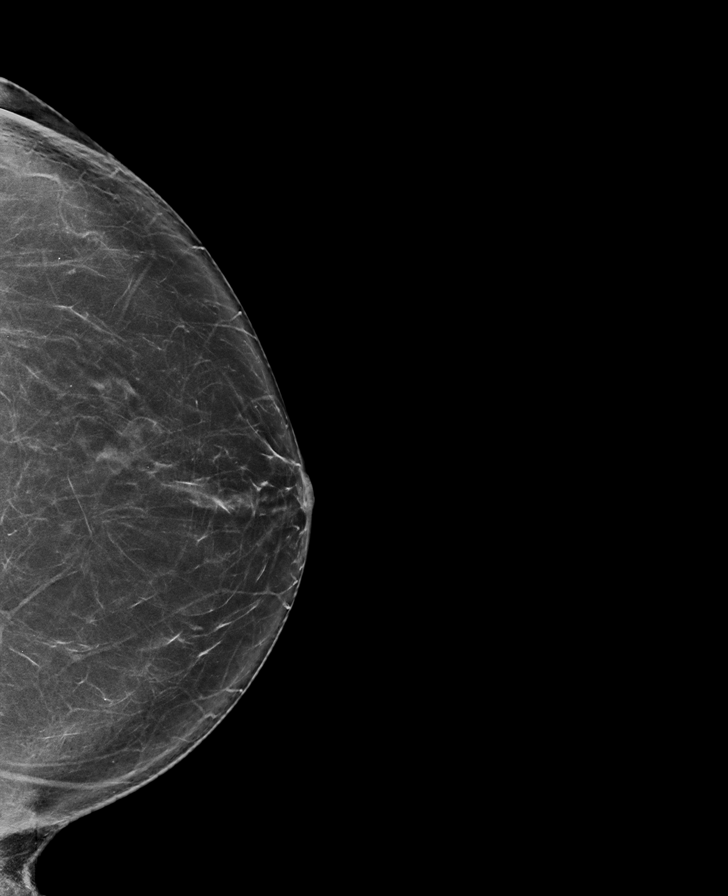

[L CC tomo · tomo slice 41/81.0]
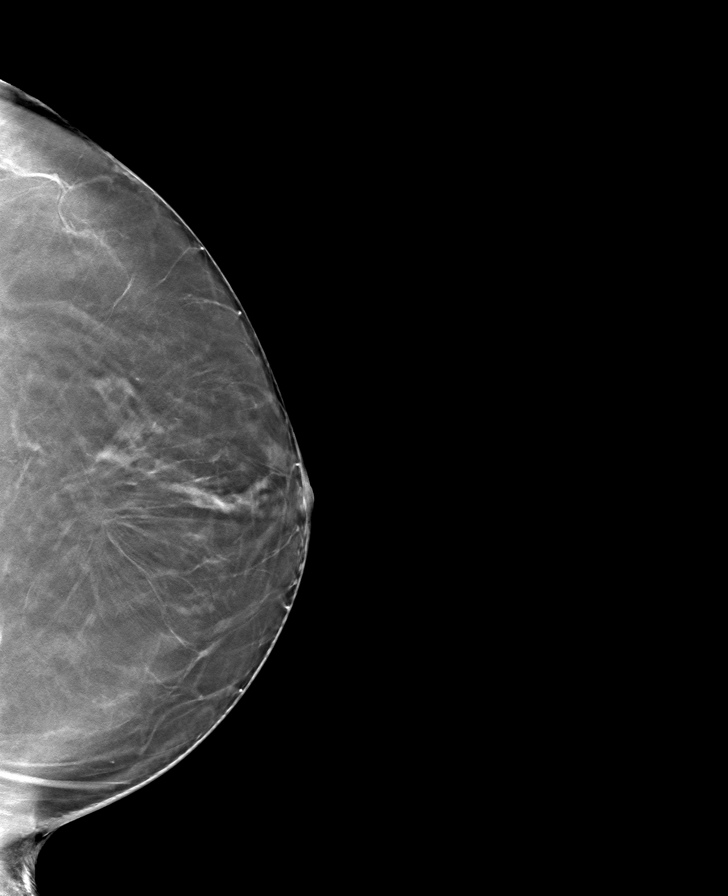

[L MLO tomo · tomo slice 44/87.0]
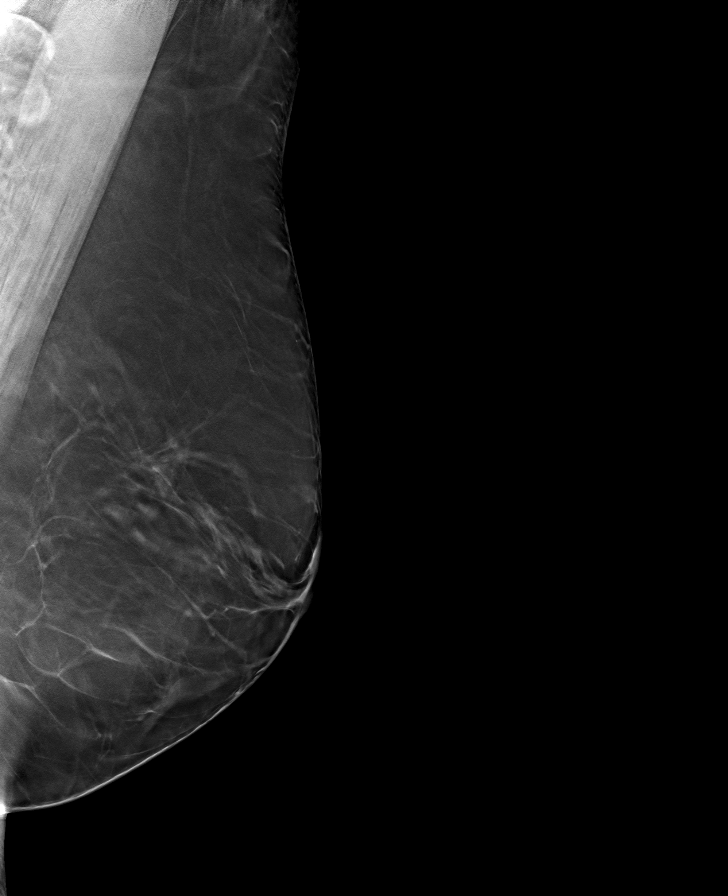

[R CC tomo · tomo slice 43/85.0]
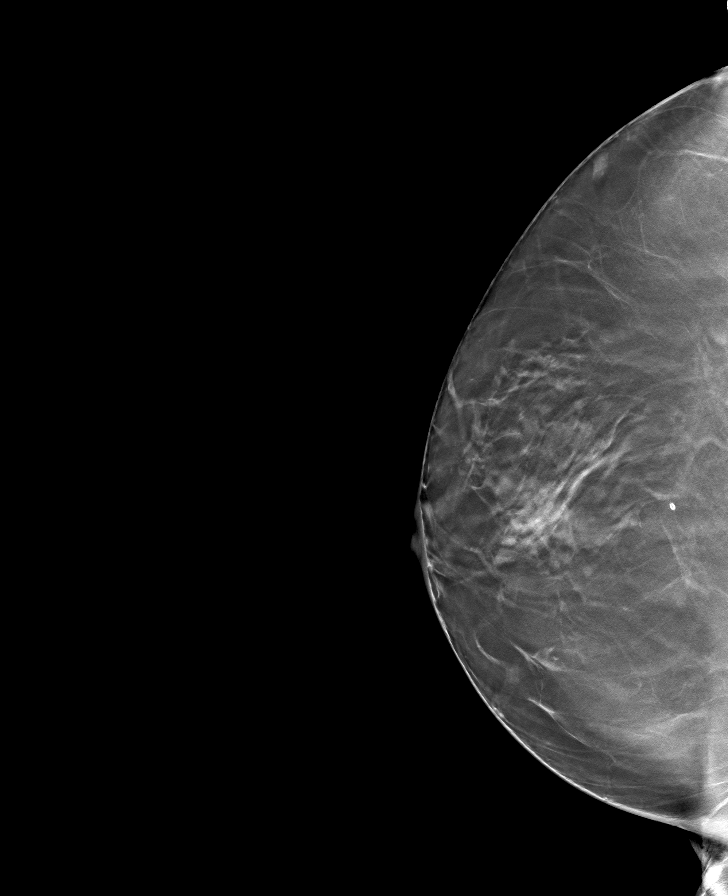

[R MLO tomo · tomo slice 46/91.0]
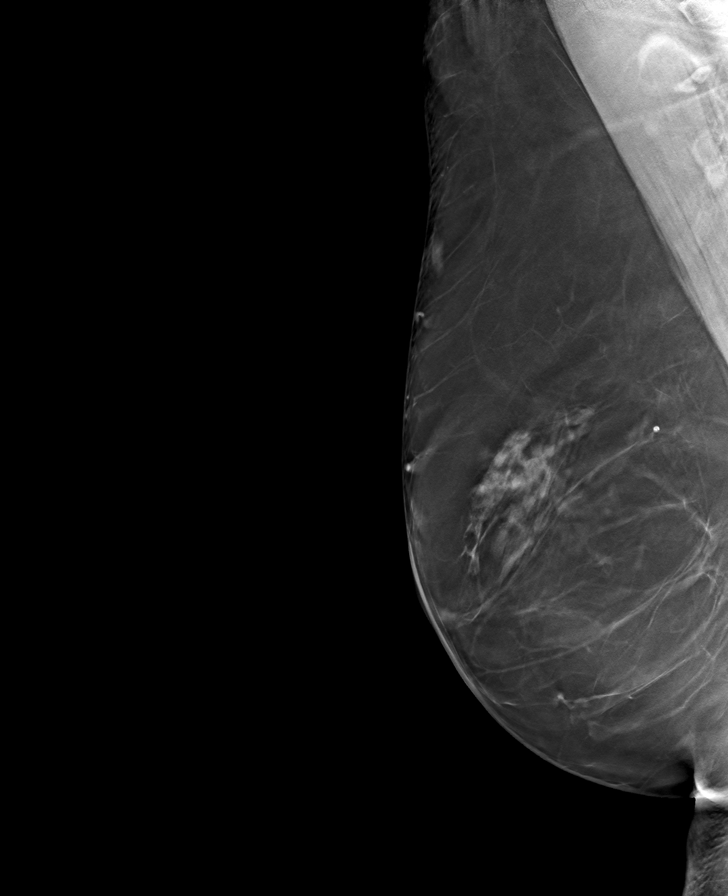

[8 of 24 positions shown; findings below may reference images not displayed]

ACR Breast Density Category b: There are scattered areas of
fibroglandular density.
FINDINGS: There are no findings suspicious for malignancy.
IMPRESSION: No mammographic evidence of malignancy. A result letter of this
screening mammogram will be mailed directly to the patient.

RECOMMENDATION:
Screening mammogram in one year. (Code:51-O-LD2)

BI-RADS CATEGORY  1: Negative.

## 2022-09-29 ENCOUNTER — Encounter: Payer: Self-pay | Admitting: Gastroenterology

## 2022-09-29 ENCOUNTER — Encounter: Payer: Self-pay | Admitting: Certified Registered Nurse Anesthetist

## 2022-10-06 ENCOUNTER — Encounter: Payer: Self-pay | Admitting: Gastroenterology

## 2022-10-06 ENCOUNTER — Ambulatory Visit (AMBULATORY_SURGERY_CENTER): Payer: 59 | Admitting: Gastroenterology

## 2022-10-06 VITALS — BP 115/59 | HR 71 | Temp 97.5°F | Resp 15 | Ht 66.0 in | Wt 173.0 lb

## 2022-10-06 DIAGNOSIS — D124 Benign neoplasm of descending colon: Secondary | ICD-10-CM | POA: Diagnosis not present

## 2022-10-06 DIAGNOSIS — D122 Benign neoplasm of ascending colon: Secondary | ICD-10-CM | POA: Diagnosis not present

## 2022-10-06 DIAGNOSIS — F419 Anxiety disorder, unspecified: Secondary | ICD-10-CM | POA: Diagnosis not present

## 2022-10-06 DIAGNOSIS — Z1211 Encounter for screening for malignant neoplasm of colon: Secondary | ICD-10-CM

## 2022-10-06 MED ORDER — SODIUM CHLORIDE 0.9 % IV SOLN: 500.0000 mL | Freq: Once | INTRAVENOUS | Status: DC

## 2022-10-06 NOTE — Progress Notes (Signed)
Report given to PACU, vss 

## 2022-10-06 NOTE — Op Note (Signed)
[facility] Patient Name: Renee Roberts Procedure Date: 10/06/2022 9:08 AM MRN: [id number] Endoscopist: Buford. Loletha Carrow , MD, [id number] Age: 66 Referring MD:  Date of Birth: August 08, 1956 Gender: Female Account #: [id number] Procedure:                Colonoscopy Indications:              Screening for colorectal malignant neoplasm                           last screening colonoscopy at least 10 years ago at                            outside practice Medicines:                Monitored Anesthesia Care Procedure:                Pre-Anesthesia Assessment:                           - Prior to the procedure, a History and Physical                            was performed, and patient medications and                            allergies were reviewed. The patient's tolerance of                            previous anesthesia was also reviewed. The risks                            and benefits of the procedure and the sedation                            options and risks were discussed with the patient.                            All questions were answered, and informed consent                            was obtained. Prior Anticoagulants: The patient has                            taken no anticoagulant or antiplatelet agents. ASA                            Grade Assessment: I - A normal, healthy patient.                            After reviewing the risks and benefits, the patient                            was deemed in satisfactory condition to undergo the  procedure.                           After obtaining informed consent, the colonoscope                            was passed under direct vision. Throughout the                            procedure, the patient's blood pressure, pulse, and                            oxygen saturations were monitored continuously. The                            CF [id number] #[id number] was introduced through the anus                             and advanced to the the cecum, identified by                            appendiceal orifice and ileocecal valve. The                            colonoscopy was performed without difficulty. The                            patient tolerated the procedure well. The quality                            of the bowel preparation was good. The ileocecal                            valve, appendiceal orifice, and rectum were                            photographed. Scope In: 9:15:31 AM Scope Out: 9:30:46 AM Scope Withdrawal Time: 0 hours 12 minutes 23 seconds  Total Procedure Duration: 0 hours 15 minutes 15 seconds  Findings:                 The perianal and digital rectal examinations were                            normal.                           Three sessile polyps were found in the descending                            colon and ascending colon. The polyps were                            diminutive in size. These polyps were removed with  a cold snare. Resection and retrieval were complete.                           Repeat examination of right colon under NBI                            performed.                           Multiple diverticula were found in the entire colon.                           The exam was otherwise without abnormality on                            direct and retroflexion views. Complications:            No immediate complications. Estimated Blood Loss:     Estimated blood loss was minimal. Impression:               - Three diminutive polyps in the descending colon                            and in the ascending colon, removed with a cold                            snare. Resected and retrieved.                           - Diverticulosis in the entire examined colon.                           - The examination was otherwise normal on direct                            and retroflexion views. Recommendation:           -  Patient has a contact number available for                            emergencies. The signs and symptoms of potential                            delayed complications were discussed with the                            patient. Return to normal activities tomorrow.                            Written discharge instructions were provided to the                            patient.                           - Resume previous diet.                           -  Continue present medications.                           - Await pathology results.                           - Repeat colonoscopy is recommended for                            surveillance. The colonoscopy date will be                            determined after pathology results from today's                            exam become available for review. Paityn Balsam L. Loletha Carrow, MD 10/06/2022 9:40:17 AM This report has been signed electronically.

## 2022-10-06 NOTE — Progress Notes (Signed)
History and Physical:  This patient presents for endoscopic testing for: Encounter Diagnosis  Name Primary?   Special screening for malignant neoplasms, colon Yes    Last colonoscopy at least 10 yrs ago at another [facility]. Patient denies chronic abdominal pain, rectal bleeding, constipation or diarrhea.   Patient is otherwise without complaints or active issues today.   Past Medical History: Past Medical History:  Diagnosis Date   Anemia    Anxiety    Depression    GERD (gastroesophageal reflux disease)      Past Surgical History: Past Surgical History:  Procedure Laterality Date   CESAREAN SECTION     x 3   Fractured Finger Left    fifth finger   REDUCTION MAMMAPLASTY Bilateral 2018   TONSILECTOMY, ADENOIDECTOMY, BILATERAL MYRINGOTOMY AND TUBES      Allergies: No Known Allergies  Outpatient Meds: Current Outpatient Medications  Medication Sig Dispense Refill   clotrimazole-betamethasone (LOTRISONE) cream Apply 1 application topically 2 (two) times daily. Use for up to 14 consecutive days as needed. (Patient not taking: Reported on 10/06/2022) 30 g 0   hydrocortisone cream 1 % Apply 1 application topically 2 (two) times daily as needed for itching.     Current Facility-Administered Medications  Medication Dose Route Frequency Provider Last Rate Last Admin   0.9 %  sodium chloride infusion  500 mL Intravenous Once Nelida Meuse III, MD          ___________________________________________________________________ Objective   Exam:  BP 138/76   Pulse 86   Temp (!) 97.5 F (36.4 C)   Ht '5\' 6"'$  (1.676 m)   Wt 173 lb (78.5 kg)   SpO2 98%   BMI 27.92 kg/m   CV: regular , S1/S2 Resp: clear to auscultation bilaterally, normal RR and effort noted GI: soft, no tenderness, with active bowel sounds.   Assessment: Encounter Diagnosis  Name Primary?   Special screening for malignant neoplasms, colon Yes     Plan: Colonoscopy  The benefits  and risks of the planned procedure were described in detail with the patient or (when appropriate) their health care proxy.  Risks were outlined as including, but not limited to, bleeding, infection, perforation, adverse medication reaction leading to cardiac or pulmonary decompensation, pancreatitis (if ERCP).  The limitation of incomplete mucosal visualization was also discussed.  No guarantees or warranties were given.    The patient is appropriate for an endoscopic procedure in the ambulatory setting.   - Wilfrid Lund, MD

## 2022-10-06 NOTE — Patient Instructions (Addendum)
Handouts on polyps and diverticulosis given to patient.  Await pathology results. Resume previous diet and continue present medications. Repeat colonoscopy for surveillance will be determined based off of pathology results.   YOU HAD AN ENDOSCOPIC PROCEDURE TODAY AT [facility]:   Refer to the procedure report that was given to you for any specific questions about what was found during the examination.  If the procedure report does not answer your questions, please call your gastroenterologist to clarify.  If you requested that your care partner not be given the details of your procedure findings, then the procedure report has been included in a sealed envelope for you to review at your convenience later.  YOU SHOULD EXPECT: Some feelings of bloating in the abdomen. Passage of more gas than usual.  Walking can help get rid of the air that was put into your GI tract during the procedure and reduce the bloating. If you had a lower endoscopy (such as a colonoscopy or flexible sigmoidoscopy) you may notice spotting of blood in your stool or on the toilet paper. If you underwent a bowel prep for your procedure, you may not have a normal bowel movement for a few days.  Please Note:  You might notice some irritation and congestion in your nose or some drainage.  This is from the oxygen used during your procedure.  There is no need for concern and it should clear up in a day or so.  SYMPTOMS TO REPORT IMMEDIATELY:  Following lower endoscopy (colonoscopy or flexible sigmoidoscopy):  Excessive amounts of blood in the stool  Significant tenderness or worsening of abdominal pains  Swelling of the abdomen that is new, acute  Fever of 100F or higher  For urgent or emergent issues, a gastroenterologist can be reached at any hour by calling [phone]. Do not use MyChart messaging for urgent concerns.    DIET:  We do recommend a small meal at first, but then you may proceed to your  regular diet.  Drink plenty of fluids but you should avoid alcoholic beverages for 24 hours.  ACTIVITY:  You should plan to take it easy for the rest of today and you should NOT DRIVE or use heavy machinery until tomorrow (because of the sedation medicines used during the test).    FOLLOW UP: Our staff will call the number listed on your records the next business day following your procedure.  We will call around 7:15- 8:00 am to check on you and address any questions or concerns that you may have regarding the information given to you following your procedure. If we do not reach you, we will leave a message.     If any biopsies were taken you will be contacted by phone or by letter within the next 1-3 weeks.  Please call us at [phone] if you have not heard about the biopsies in 3 weeks.    SIGNATURES/CONFIDENTIALITY: You and/or your care partner have signed paperwork which will be entered into your electronic medical record.  These signatures attest to the fact that that the information above on your After Visit Summary has been reviewed and is understood.  Full responsibility of the confidentiality of this discharge information lies with you and/or your care-partner.

## 2022-10-06 NOTE — Progress Notes (Signed)
Pt's states no medical or surgical changes since previsit or office visit. 

## 2022-10-07 ENCOUNTER — Telehealth: Payer: Self-pay | Admitting: *Deleted

## 2022-10-07 NOTE — Telephone Encounter (Signed)
  Follow up Call-     10/06/2022    8:04 AM  Call back number  Post procedure Call Back phone  # [phone]  Permission to leave phone message Yes     Patient questions:  Do you have a fever, pain , or abdominal swelling? No. Pain Score  0 *  Have you tolerated food without any problems? Yes.    Have you been able to return to your normal activities? Yes.    Do you have any questions about your discharge instructions: Diet   No. Medications  No. Follow up visit  No.  Do you have questions or concerns about your Care? No.  Actions: * If pain score is 4 or above: No action needed, pain <4.

## 2022-10-16 ENCOUNTER — Encounter: Payer: Self-pay | Admitting: Gastroenterology
